# Patient Record
Sex: Male | Born: 1975 | Race: White | Hispanic: No | Smoking: Current every day smoker
Health system: Southern US, Community
[De-identification: ages and names within clinical notes are randomized; demographics above are authoritative.]

## PROBLEM LIST (undated history)

## (undated) DIAGNOSIS — E079 Disorder of thyroid, unspecified: Secondary | ICD-10-CM

## (undated) DIAGNOSIS — M25569 Pain in unspecified knee: Secondary | ICD-10-CM

## (undated) DIAGNOSIS — F1010 Alcohol abuse, uncomplicated: Secondary | ICD-10-CM

## (undated) HISTORY — PX: CLEFT PALATE REPAIR: SUR1165

---

## 1997-09-18 ENCOUNTER — Emergency Department (HOSPITAL_COMMUNITY): Admission: EM | Admit: 1997-09-18 | Discharge: 1997-09-18 | Payer: Self-pay | Admitting: Emergency Medicine

## 2008-08-21 ENCOUNTER — Emergency Department (HOSPITAL_COMMUNITY): Admission: EM | Admit: 2008-08-21 | Discharge: 2008-08-22 | Payer: Self-pay | Admitting: Emergency Medicine

## 2009-05-07 ENCOUNTER — Emergency Department: Payer: Self-pay | Admitting: Emergency Medicine

## 2009-08-13 ENCOUNTER — Emergency Department (HOSPITAL_COMMUNITY): Admission: EM | Admit: 2009-08-13 | Discharge: 2009-08-14 | Payer: Self-pay | Admitting: Emergency Medicine

## 2010-09-07 LAB — DIFFERENTIAL
Basophils Absolute: 0.1 10*3/uL (ref 0.0–0.1)
Lymphocytes Relative: 22 % (ref 12–46)
Neutro Abs: 8.6 10*3/uL — ABNORMAL HIGH (ref 1.7–7.7)
Neutrophils Relative %: 71 % (ref 43–77)

## 2010-09-07 LAB — URINALYSIS, ROUTINE W REFLEX MICROSCOPIC
Nitrite: NEGATIVE
Specific Gravity, Urine: 1.009 (ref 1.005–1.030)
Urobilinogen, UA: 0.2 mg/dL (ref 0.0–1.0)
pH: 5.5 (ref 5.0–8.0)

## 2010-09-07 LAB — BASIC METABOLIC PANEL
BUN: 5 mg/dL — ABNORMAL LOW (ref 6–23)
Calcium: 9.5 mg/dL (ref 8.4–10.5)
Creatinine, Ser: 0.73 mg/dL (ref 0.4–1.5)
GFR calc non Af Amer: >60 mL/min (ref >60)
Glucose, Bld: 96 mg/dL (ref 70–99)
Sodium: 138 mEq/L (ref 135–145)

## 2010-09-07 LAB — RAPID URINE DRUG SCREEN, HOSP PERFORMED
Amphetamines: NOT DETECTED
Barbiturates: NOT DETECTED
Benzodiazepines: NOT DETECTED

## 2010-09-07 LAB — CBC
Hemoglobin: 14.8 g/dL (ref 13.0–17.0)
Platelets: 195 10*3/uL (ref 150–400)
RDW: 13.2 % (ref 11.5–15.5)

## 2010-09-29 LAB — DIFFERENTIAL
Basophils Absolute: 0 10*3/uL (ref 0.0–0.1)
Basophils Relative: 0 % (ref 0–1)
Lymphocytes Relative: 20 % (ref 12–46)
Monocytes Absolute: 0.8 10*3/uL (ref 0.1–1.0)
Neutro Abs: 7.9 10*3/uL — ABNORMAL HIGH (ref 1.7–7.7)
Neutrophils Relative %: 72 % (ref 43–77)

## 2010-09-29 LAB — BASIC METABOLIC PANEL
Calcium: 9 mg/dL (ref 8.4–10.5)
Creatinine, Ser: 0.93 mg/dL (ref 0.4–1.5)
GFR calc Af Amer: >60 mL/min (ref >60)
GFR calc non Af Amer: >60 mL/min (ref >60)
Glucose, Bld: 92 mg/dL (ref 70–99)
Sodium: 138 mEq/L (ref 135–145)

## 2010-09-29 LAB — CBC
Hemoglobin: 14.2 g/dL (ref 13.0–17.0)
RBC: 4.95 MIL/uL (ref 4.22–5.81)
RDW: 13.8 % (ref 11.5–15.5)

## 2010-09-29 LAB — RAPID URINE DRUG SCREEN, HOSP PERFORMED
Benzodiazepines: NOT DETECTED
Cocaine: POSITIVE — AB
Opiates: NOT DETECTED

## 2010-09-29 LAB — TRICYCLICS SCREEN, URINE: TCA Scrn: NOT DETECTED

## 2012-07-09 ENCOUNTER — Emergency Department (HOSPITAL_COMMUNITY)
Admission: EM | Admit: 2012-07-09 | Discharge: 2012-07-09 | Disposition: A | Discharge status: Home / Self Care | Payer: Self-pay | Attending: Emergency Medicine | Admitting: Emergency Medicine

## 2012-07-09 ENCOUNTER — Encounter (HOSPITAL_COMMUNITY): Payer: Self-pay

## 2012-07-09 DIAGNOSIS — Y929 Unspecified place or not applicable: Secondary | ICD-10-CM | POA: Insufficient documentation

## 2012-07-09 DIAGNOSIS — R110 Nausea: Secondary | ICD-10-CM | POA: Insufficient documentation

## 2012-07-09 DIAGNOSIS — R5381 Other malaise: Secondary | ICD-10-CM | POA: Insufficient documentation

## 2012-07-09 DIAGNOSIS — T48201A Poisoning by unspecified drugs acting on muscles, accidental (unintentional), initial encounter: Secondary | ICD-10-CM | POA: Insufficient documentation

## 2012-07-09 DIAGNOSIS — Y9389 Activity, other specified: Secondary | ICD-10-CM | POA: Insufficient documentation

## 2012-07-09 DIAGNOSIS — T483X4A Poisoning by antitussives, undetermined, initial encounter: Secondary | ICD-10-CM | POA: Insufficient documentation

## 2012-07-09 DIAGNOSIS — G479 Sleep disorder, unspecified: Secondary | ICD-10-CM | POA: Insufficient documentation

## 2012-07-09 DIAGNOSIS — F19951 Other psychoactive substance use, unspecified with psychoactive substance-induced psychotic disorder with hallucinations: Secondary | ICD-10-CM | POA: Insufficient documentation

## 2012-07-09 DIAGNOSIS — F411 Generalized anxiety disorder: Secondary | ICD-10-CM | POA: Insufficient documentation

## 2012-07-09 HISTORY — DX: Pain in unspecified knee: M25.569

## 2012-07-09 LAB — LIPASE, BLOOD: Lipase: 77 U/L — ABNORMAL HIGH (ref 11–59)

## 2012-07-09 LAB — COMPREHENSIVE METABOLIC PANEL
AST: 20 U/L (ref 0–37)
BUN: 10 mg/dL (ref 6–23)
CO2: 28 mEq/L (ref 19–32)
Calcium: 9.6 mg/dL (ref 8.4–10.5)
Chloride: 102 mEq/L (ref 96–112)
Creatinine, Ser: 0.93 mg/dL (ref 0.50–1.35)
GFR calc Af Amer: >90 mL/min (ref >90)
GFR calc non Af Amer: >90 mL/min (ref >90)
Glucose, Bld: 89 mg/dL (ref 70–99)
Total Bilirubin: 0.6 mg/dL (ref 0.3–1.2)

## 2012-07-09 LAB — CBC WITH DIFFERENTIAL/PLATELET
Eosinophils Absolute: 0 10*3/uL (ref 0.0–0.7)
Hemoglobin: 15.1 g/dL (ref 13.0–17.0)
Lymphocytes Relative: 9 % — ABNORMAL LOW (ref 12–46)
Lymphs Abs: 0.9 10*3/uL (ref 0.7–4.0)
Monocytes Relative: 6 % (ref 3–12)
Neutro Abs: 9.3 10*3/uL — ABNORMAL HIGH (ref 1.7–7.7)
Neutrophils Relative %: 84 % — ABNORMAL HIGH (ref 43–77)
Platelets: 236 10*3/uL (ref 150–400)
RBC: 5.28 MIL/uL (ref 4.22–5.81)
WBC: 11 10*3/uL — ABNORMAL HIGH (ref 4.0–10.5)

## 2012-07-09 LAB — ETHANOL: Alcohol, Ethyl (B): <11 mg/dL (ref 0–11)

## 2012-07-09 MED ORDER — ONDANSETRON HCL 4 MG/2ML IJ SOLN
4.0000 mg | Freq: Once | INTRAMUSCULAR | Status: AC
  Administered 2012-07-09: 4 mg via INTRAVENOUS
  Filled 2012-07-09: qty 2

## 2012-07-09 MED ORDER — SODIUM CHLORIDE 0.9 % IV BOLUS (SEPSIS)
1000.0000 mL | Freq: Once | INTRAVENOUS | Status: AC
  Administered 2012-07-09: 1000 mL via INTRAVENOUS

## 2012-07-09 NOTE — ED Notes (Signed)
Pt states that he took delsyum and drank ETOH yesterday afternoon and this morning, he has an unsteady gait, shaky and states that he feels very weird

## 2012-07-09 NOTE — ED Notes (Signed)
Pt reports that he took a complete bottle of Delsyum one day ago with two beers and now "feels strange" today. Neuro intact denies AV hallucinations.

## 2012-07-09 NOTE — ED Provider Notes (Signed)
History     CSN: [id number]  Arrival date & time 07/09/12  4098   First MD Initiated Contact with Patient 07/09/12 [phone]      Chief Complaint  Patient presents with  . Ingestion    (Consider location/radiation/quality/duration/timing/severity/associated sxs/prior treatment) HPI  The patient presents with concerns of ongoing nausea, gait unsteadiness, hallucinations. He states that yesterday, ending approximately 12 hours ago, he ingested alcohol, beer, and Delsym.  He states that he drinks several 24 ounce bottles of beer in addition to an entire bottle of Delsym. He states that since that time he has been persistently uncomfortable, with the aforementioned complaints.  There's been no relief with resting, or anything else.  Symptoms are worse with motion, speaking. He describes visual disturbances, including hallucinations, and inappropriate motion of inanimate objects.  He denies auditory hallucinations.  He states that he has no similar prior experience.  He denies significant medical problems, beyond [facility], status post ablation.   Past Medical History  Diagnosis Date  . Knee joint pain     History reviewed. No pertinent past surgical history.  History reviewed. No pertinent family history.  History  Substance Use Topics  . Smoking status: Not on file  . Smokeless tobacco: Not on file  . Alcohol Use: Yes      Review of Systems  Constitutional:       Per HPI, otherwise negative  HENT:       Per HPI, otherwise negative  Eyes: Negative.   Respiratory:       Per HPI, otherwise negative  Cardiovascular:       Per HPI, otherwise negative  Gastrointestinal: Positive for nausea. Negative for vomiting and diarrhea.  Genitourinary: Negative.   Musculoskeletal:       Per HPI, otherwise negative  Skin: Negative.   Neurological: Positive for dizziness, weakness and light-headedness. Negative for tremors, seizures, syncope, speech difficulty, numbness and  headaches.  Psychiatric/Behavioral: Positive for hallucinations, confusion, sleep disturbance and decreased concentration. Negative for self-injury. The patient is nervous/anxious.     Allergies  Review of patient's allergies indicates no known allergies.  Home Medications   Current Outpatient Rx  Name  Route  Sig  Dispense  Refill  . NAPROXEN 500 MG PO TABS   Oral   Take 500 mg by mouth 2 (two) times daily with a meal.           BP 153/84  Pulse 96  Temp 98.1 F (36.7 C) (Oral)  Resp 20  SpO2 93%  Physical Exam  Nursing note and vitals reviewed. Constitutional: He is oriented to person, place, and time. He appears well-developed.       Anxious thin male  HENT:  Head: Normocephalic and atraumatic.  Eyes: Conjunctivae normal and EOM are normal.  Cardiovascular: Normal rate and regular rhythm.   Pulmonary/Chest: Effort normal. No stridor. No respiratory distress.  Abdominal: He exhibits no distension.  Musculoskeletal: He exhibits no edema.  Neurological: He is alert and oriented to person, place, and time.       Patient has mild discoordination, dysdiadochokinesia, but is grossly symmetrically intact  Skin: Skin is warm and dry.  Psychiatric: His speech is normal. His mood appears anxious. He is actively hallucinating. Thought content is not paranoid. Cognition and memory are normal. He expresses no homicidal and no suicidal ideation.    ED Course  Procedures (including critical care time)   Labs Reviewed  COMPREHENSIVE METABOLIC PANEL  CBC WITH DIFFERENTIAL  LIPASE, BLOOD  URINE RAPID DRUG SCREEN (HOSP PERFORMED)  ETHANOL  URINALYSIS, ROUTINE W REFLEX MICROSCOPIC   No results found.   No diagnosis found.  Pulse ox 99% room air normal    10:07 AM Patient states she is substantially better MDM  This patient presents 12 hours after last ingesting alcohol and an over-the-counter cough medication, known for its dissociative properties.  On exam the  patient is in no distress, hemodynamically stable.  With IV fluid resuscitation, the patient felt substantially better.  Patient's labs were reassuring, with no evidence of electrolyte abnormality.  Given the improvement, the absence of ongoing ingestion, he was discharged in stable condition after being cautioned against additional use of that medication.        Gerhard Munch, MD 07/09/12 1009

## 2013-03-30 ENCOUNTER — Emergency Department: Payer: Self-pay | Admitting: Emergency Medicine

## 2013-03-30 LAB — CBC
HGB: 14.7 g/dL (ref 13.0–18.0)
MCH: 28.5 pg (ref 26.0–34.0)
Platelet: 225 10*3/uL (ref 150–440)
RBC: 5.14 10*6/uL (ref 4.40–5.90)
RDW: 13.9 % (ref 11.5–14.5)

## 2013-03-30 LAB — DRUG SCREEN, URINE
Amphetamines, Ur Screen: NEGATIVE (ref ?–1000)
Benzodiazepine, Ur Scrn: NEGATIVE (ref ?–200)
Cocaine Metabolite,Ur ~~LOC~~: NEGATIVE (ref ?–300)
Methadone, Ur Screen: NEGATIVE (ref ?–300)
Opiate, Ur Screen: NEGATIVE (ref ?–300)
Phencyclidine (PCP) Ur S: NEGATIVE (ref ?–25)
Tricyclic, Ur Screen: NEGATIVE (ref ?–1000)

## 2013-03-30 LAB — URINALYSIS, COMPLETE
Glucose,UR: NEGATIVE mg/dL (ref 0–75)
Ketone: NEGATIVE
Leukocyte Esterase: NEGATIVE
Ph: 6 (ref 4.5–8.0)
Protein: NEGATIVE
RBC,UR: NONE SEEN /HPF (ref 0–5)
Specific Gravity: 1.001 (ref 1.003–1.030)
Squamous Epithelial: NONE SEEN

## 2013-03-30 LAB — COMPREHENSIVE METABOLIC PANEL
Alkaline Phosphatase: 107 U/L (ref 50–136)
BUN: 8 mg/dL (ref 7–18)
Bilirubin,Total: 0.5 mg/dL (ref 0.2–1.0)
Calcium, Total: 9 mg/dL (ref 8.5–10.1)
Co2: 27 mmol/L (ref 21–32)
Creatinine: 0.89 mg/dL (ref 0.60–1.30)
EGFR (African American): >60
EGFR (Non-African Amer.): >60
Osmolality: 266 (ref 275–301)
Potassium: 3.6 mmol/L (ref 3.5–5.1)
SGOT(AST): 130 U/L — ABNORMAL HIGH (ref 15–37)
SGPT (ALT): 135 U/L — ABNORMAL HIGH (ref 12–78)
Sodium: 134 mmol/L — ABNORMAL LOW (ref 136–145)
Total Protein: 7.4 g/dL (ref 6.4–8.2)

## 2013-03-30 LAB — ETHANOL: Ethanol: 239 mg/dL

## 2013-03-31 LAB — ETHANOL: Ethanol %: 0.095 % — ABNORMAL HIGH (ref 0.000–0.080)

## 2013-04-07 ENCOUNTER — Encounter (HOSPITAL_COMMUNITY): Payer: Self-pay | Admitting: Emergency Medicine

## 2013-04-07 ENCOUNTER — Emergency Department (HOSPITAL_COMMUNITY)
Admission: EM | Admit: 2013-04-07 | Discharge: 2013-04-08 | Disposition: A | Discharge status: Rehabilitation Facility | Payer: Self-pay | Attending: Emergency Medicine | Admitting: Emergency Medicine

## 2013-04-07 DIAGNOSIS — F17200 Nicotine dependence, unspecified, uncomplicated: Secondary | ICD-10-CM | POA: Insufficient documentation

## 2013-04-07 DIAGNOSIS — F1010 Alcohol abuse, uncomplicated: Secondary | ICD-10-CM | POA: Insufficient documentation

## 2013-04-07 HISTORY — DX: Alcohol abuse, uncomplicated: F10.10

## 2013-04-07 LAB — COMPREHENSIVE METABOLIC PANEL
ALT: 166 U/L — ABNORMAL HIGH (ref 0–53)
Albumin: 3.9 g/dL (ref 3.5–5.2)
Alkaline Phosphatase: 93 U/L (ref 39–117)
BUN: 7 mg/dL (ref 6–23)
Chloride: 104 mEq/L (ref 96–112)
Glucose, Bld: 93 mg/dL (ref 70–99)
Potassium: 4.2 mEq/L (ref 3.5–5.1)
Sodium: 140 mEq/L (ref 135–145)
Total Bilirubin: <0.1 mg/dL — ABNORMAL LOW (ref 0.3–1.2)
Total Protein: 7.1 g/dL (ref 6.0–8.3)

## 2013-04-07 LAB — CBC
HCT: 42.1 % (ref 39.0–52.0)
Hemoglobin: 14.3 g/dL (ref 13.0–17.0)
MCHC: 34 g/dL (ref 30.0–36.0)
RDW: 13.6 % (ref 11.5–15.5)
WBC: 6.7 10*3/uL (ref 4.0–10.5)

## 2013-04-07 LAB — RAPID URINE DRUG SCREEN, HOSP PERFORMED
Amphetamines: NOT DETECTED
Barbiturates: NOT DETECTED
Benzodiazepines: NOT DETECTED

## 2013-04-07 MED ORDER — LORAZEPAM 1 MG PO TABS: 0.0000 mg | ORAL_TABLET | Freq: Four times a day (QID) | ORAL | Status: DC

## 2013-04-07 MED ORDER — VITAMIN B-1 100 MG PO TABS
100.0000 mg | ORAL_TABLET | Freq: Every day | ORAL | Status: DC
  Administered 2013-04-08: 09:00:00 100 mg via ORAL
  Filled 2013-04-07: qty 1

## 2013-04-07 MED ORDER — THIAMINE HCL 100 MG/ML IJ SOLN: 100.0000 mg | Freq: Every day | INTRAMUSCULAR | Status: DC

## 2013-04-07 MED ORDER — LORAZEPAM 1 MG PO TABS: 0.0000 mg | ORAL_TABLET | Freq: Two times a day (BID) | ORAL | Status: DC

## 2013-04-07 NOTE — ED Notes (Signed)
Pt brought in by EMS requesting detox from alcohol  Pt states he drinks about a case a day  Pt states he has been drinking since he was 37 years old

## 2013-04-07 NOTE — ED Notes (Signed)
Pt belongs has: brown boots, [place] hat, blue jeans, black belt, black flip phone, electronic cig, green lighter, dark green wallet with [place] driver license, social security card, blue watch, (5) one dollar bills, and changes, white socks, dark blue t-shirt

## 2013-04-08 ENCOUNTER — Encounter (HOSPITAL_COMMUNITY): Payer: Self-pay | Admitting: Registered Nurse

## 2013-04-08 DIAGNOSIS — Z9289 Personal history of other medical treatment: Secondary | ICD-10-CM

## 2013-04-08 DIAGNOSIS — F1010 Alcohol abuse, uncomplicated: Secondary | ICD-10-CM | POA: Diagnosis present

## 2013-04-08 DIAGNOSIS — F1910 Other psychoactive substance abuse, uncomplicated: Secondary | ICD-10-CM

## 2013-04-08 LAB — ETHANOL: Alcohol, Ethyl (B): 24 mg/dL — ABNORMAL HIGH (ref 0–11)

## 2013-04-08 NOTE — ED Provider Notes (Signed)
Medical screening examination/treatment/procedure(s) were performed by non-physician practitioner and as supervising physician I was immediately available for consultation/collaboration.  Shon Baton, MD 04/08/13 [phone]

## 2013-04-08 NOTE — ED Provider Notes (Signed)
CSN: [id number]     Arrival date & time 04/07/13  2048 History   First MD Initiated Contact with Patient 04/07/13 2220     Chief Complaint  Patient presents with  . Medical Clearance   (Consider location/radiation/quality/duration/timing/severity/associated sxs/prior Treatment) HPI Comments: Patient here requesting detox from alcohol - reports that he drinks daily including today, usually drinking about 1 case of beer a day.  He reports that his last drink was several hours prior to arrival here.  He states that he has tried to quit on numerous occasions and has stayed clean for only short periods of time.  He reports no history of withdrawal or DT's.  He denies SI/HI.  Patient is a 37 y.o. male presenting with alcohol problem. The history is provided by the patient. No language interpreter was used.  Alcohol Problem This is a chronic problem. The problem occurs constantly. The problem has been unchanged. Pertinent negatives include no abdominal pain, arthralgias, chest pain, coughing, headaches, myalgias, nausea, numbness, sore throat, urinary symptoms, vertigo, visual change or vomiting. Nothing aggravates the symptoms. He has tried nothing for the symptoms. The treatment provided no relief.    Past Medical History  Diagnosis Date  . Knee joint pain   . Alcohol abuse    Past Surgical History  Procedure Laterality Date  . Cleft palate repair     Family History  Problem Relation Age of Onset  . Cancer Other    History  Substance Use Topics  . Smoking status: Current Every Day Smoker  . Smokeless tobacco: Not on file  . Alcohol Use: Yes     Comment: heavy    Review of Systems  HENT: Negative for sore throat.   Respiratory: Negative for cough.   Cardiovascular: Negative for chest pain.  Gastrointestinal: Negative for nausea, vomiting and abdominal pain.  Musculoskeletal: Negative for arthralgias and myalgias.  Neurological: Negative for vertigo, numbness and headaches.  All  other systems reviewed and are negative.    Allergies  Review of patient's allergies indicates no known allergies.  Home Medications   Current Outpatient Rx  Name  Route  Sig  Dispense  Refill  . acetaminophen (TYLENOL) 325 MG tablet   Oral   Take 650 mg by mouth every 6 (six) hours as needed for pain (pain/back pain).          BP 124/83  Pulse 99  Temp(Src) 98.2 F (36.8 C) (Oral)  Resp 20  SpO2 95% Physical Exam  Nursing note and vitals reviewed. Constitutional: He is oriented to person, place, and time. He appears well-developed and well-nourished. No distress.  HENT:  Head: Normocephalic and atraumatic.  Right Ear: External ear normal.  Left Ear: External ear normal.  Nose: Nose normal.  Mouth/Throat: Oropharynx is clear and moist. No oropharyngeal exudate.  Eyes: Conjunctivae are normal. Pupils are equal, round, and reactive to light. No scleral icterus.  Neck: Neck supple.  Cardiovascular: Normal rate, regular rhythm and normal heart sounds.  Exam reveals no gallop and no friction rub.   No murmur heard. Pulmonary/Chest: Effort normal and breath sounds normal. No respiratory distress. He has no wheezes. He has no rales. He exhibits no tenderness.  Abdominal: Soft. Bowel sounds are normal. He exhibits no distension. There is no tenderness.  Musculoskeletal: Normal range of motion. He exhibits no edema and no tenderness.  Lymphadenopathy:    He has no cervical adenopathy.  Neurological: He is alert and oriented to person, place, and time. He exhibits normal  muscle tone. Coordination normal.  Skin: Skin is warm and dry. No rash noted. No erythema. No pallor.  Psychiatric: He has a normal mood and affect. His behavior is normal. Judgment and thought content normal.    ED Course  Procedures (including critical care time) Labs Review Labs Reviewed  COMPREHENSIVE METABOLIC PANEL - Abnormal; Notable for the following:    AST 76 (*)    ALT 166 (*)    Total Bilirubin  <0.1 (*)    All other components within normal limits  ETHANOL - Abnormal; Notable for the following:    Alcohol, Ethyl (B) 293 (*)    All other components within normal limits  URINE RAPID DRUG SCREEN (HOSP PERFORMED) - Abnormal; Notable for the following:    Tetrahydrocannabinol POSITIVE (*)    All other components within normal limits  CBC   Imaging Review No results found.  EKG Interpretation   None      Results for orders placed during the hospital encounter of 04/07/13  CBC      Result Value Range   WBC 6.7  4.0 - 10.5 K/uL   RBC 4.99  4.22 - 5.81 MIL/uL   Hemoglobin 14.3  13.0 - 17.0 g/dL   HCT 09.8  11.9 - 14.7 %   MCV 84.4  78.0 - 100.0 fL   MCH 28.7  26.0 - 34.0 pg   MCHC 34.0  30.0 - 36.0 g/dL   RDW 82.9  56.2 - 13.0 %   Platelets 238  150 - 400 K/uL  COMPREHENSIVE METABOLIC PANEL      Result Value Range   Sodium 140  135 - 145 mEq/L   Potassium 4.2  3.5 - 5.1 mEq/L   Chloride 104  96 - 112 mEq/L   CO2 25  19 - 32 mEq/L   Glucose, Bld 93  70 - 99 mg/dL   BUN 7  6 - 23 mg/dL   Creatinine, Ser 8.65  0.50 - 1.35 mg/dL   Calcium 8.9  8.4 - 78.4 mg/dL   Total Protein 7.1  6.0 - 8.3 g/dL   Albumin 3.9  3.5 - 5.2 g/dL   AST 76 (*) 0 - 37 U/L   ALT 166 (*) 0 - 53 U/L   Alkaline Phosphatase 93  39 - 117 U/L   Total Bilirubin <0.1 (*) 0.3 - 1.2 mg/dL   GFR calc non Af Amer >90  >90 mL/min   GFR calc Af Amer >90  >90 mL/min  ETHANOL      Result Value Range   Alcohol, Ethyl (B) 293 (*) 0 - 11 mg/dL  URINE RAPID DRUG SCREEN (HOSP PERFORMED)      Result Value Range   Opiates NONE DETECTED  NONE DETECTED   Cocaine NONE DETECTED  NONE DETECTED   Benzodiazepines NONE DETECTED  NONE DETECTED   Amphetamines NONE DETECTED  NONE DETECTED   Tetrahydrocannabinol POSITIVE (*) NONE DETECTED   Barbiturates NONE DETECTED  NONE DETECTED   No results found.   MDM  Alcohol abuse  Patient with long history of alcohol abuse presents requesting detox.  Alcohol level is  currently 293, though he is able to converse freely will await until alcohol level much lower to continue ACT assessment.  Izola Price Marisue Humble, PA-C 04/08/13 [phone]

## 2013-04-08 NOTE — BH Assessment (Signed)
Tele Assessment Note   Anthony Beck is an 37 y.o. male.  Patient came to [facility] seeking assistance with getting ETOH detox.  Patient reports drinking a case of beer daily for at least the last 6 months.  His last drink was on 10/20 around 20:00.  Patient reports no withdrawal symptoms at this time.  He has had a 3 year period of being sober which was about 3 years ago.  He uses marijuana but not too often, 1-2 times per month.  Patient denies any current SI/HI or A/V hallucinations.  He was in detox at [facility] abut 2 years ago.   -Patient care was discussed with this clinician and Dr. Dierdre Highman.  Patient has an elevated BAL and will need to be below 160 to be considered by [facility] or [facility].  A new BAL will be done at 07:00 then patient will be referred to the aforementioned facilities for detox. Axis I: 303.90 ETOH dependence Axis II: Deferred Axis III:  Past Medical History  Diagnosis Date  . Knee joint pain   . Alcohol abuse    Axis IV: housing problems, occupational problems and problems with primary support group Axis V: 31-40 impairment in reality testing  Past Medical History:  Past Medical History  Diagnosis Date  . Knee joint pain   . Alcohol abuse     Past Surgical History  Procedure Laterality Date  . Cleft palate repair      Family History:  Family History  Problem Relation Age of Onset  . Cancer Other     Social History:  reports that he has been smoking.  He does not have any smokeless tobacco history on file. He reports that he drinks alcohol. He reports that he does not use illicit drugs.  Additional Social History:  Alcohol / Drug Use Pain Medications: None Prescriptions: N/A Over the Counter: N/A History of alcohol / drug use?: Yes Longest period of sobriety (when/how long): Was sober for 3 years. Negative Consequences of Use: Personal relationships Withdrawal Symptoms: Diarrhea;Weakness;Tremors;Patient aware of relationship between substance abuse and physical/medical  complications;Nausea / Vomiting Substance #1 Name of Substance 1: ETOH, usually beer 1 - Age of First Use: 37 years of age 69 - Amount (size/oz): Drinking a case of beer daily 1 - Frequency: Daily consumption 1 - Duration: Drinking at that rate for last 6 months 1 - Last Use / Amount: 10/20 around 20:00 Substance #2 Name of Substance 2: Marijuana 2 - Age of First Use: Teens 2 - Amount (size/oz): Unknown 2 - Frequency: 1-2 times in a month 2 - Duration: on-going 2 - Last Use / Amount: 10/20  CIWA: CIWA-Ar BP: 124/82 mmHg Pulse Rate: 94 Nausea and Vomiting: no nausea and no vomiting Tactile Disturbances: none Tremor: no tremor Auditory Disturbances: not present Paroxysmal Sweats: no sweat visible Visual Disturbances: not present Anxiety: no anxiety, at ease Headache, Fullness in Head: none present Agitation: normal activity Orientation and Clouding of Sensorium: oriented and can do serial additions CIWA-Ar Total: 0 COWS:    Allergies: No Known Allergies  Home Medications:  (Not in a hospital admission)  OB/GYN Status:  No LMP for male patient.  General Assessment Data Location of Assessment: [facility] ED Is this a Tele or Face-to-Face Assessment?: Tele Assessment Is this an Initial Assessment or a Re-assessment for this encounter?: Initial Assessment Living Arrangements: Other (Comment) (Homeless in [place]) Can pt return to current living arrangement?: Yes Admission Status: Voluntary Is patient capable of signing voluntary admission?: Yes  Transfer from: Acute Hospital Referral Source: Self/Family/Friend     [facility] Crisis Care Plan Living Arrangements: Other (Comment) (Homeless in [facility]) Name of Psychiatrist: N/A Name of Therapist: N/A     Risk to self Suicidal Ideation: No Suicidal Intent: No Is patient at risk for suicide?: No Suicidal Plan?: No Access to Means: No What has been your use of drugs/alcohol within the last 12 months?: ETOH daily  use Previous Attempts/Gestures: No How many times?: 0 Other Self Harm Risks: SA issues Triggers for Past Attempts: None known Intentional Self Injurious Behavior: None Family Suicide History: No Recent stressful life event(s): Other (Comment);Financial Problems (Homelessness) Persecutory voices/beliefs?: No Depression: No Depression Symptoms:  (Pt reports no depressive symptoms) Substance abuse history and/or treatment for substance abuse?: Yes Suicide prevention information given to non-admitted patients: Not applicable  Risk to Others Homicidal Ideation: No Thoughts of Harm to Others: No Current Homicidal Intent: No Current Homicidal Plan: No Access to Homicidal Means: No Identified Victim: No one History of harm to others?: No Assessment of Violence: None Noted Violent Behavior Description: Pt calm and cooperative Does patient have access to weapons?: No Criminal Charges Pending?: No Does patient have a court date: No  Psychosis Hallucinations: None noted Delusions: None noted  Mental Status Report Appear/Hygiene: Disheveled Eye Contact: Fair Motor Activity: Freedom of movement;Unremarkable Speech: Logical/coherent Level of Consciousness: Quiet/awake Mood: Helpless Affect: Appropriate to circumstance Anxiety Level: None Thought Processes: Coherent;Relevant Judgement: Impaired Orientation: Person;Place;Time;Situation;Appropriate for developmental age Obsessive Compulsive Thoughts/Behaviors: None  Cognitive Functioning Concentration: Normal Memory: Recent Intact;Remote Intact IQ: Average Insight: Fair Impulse Control: Poor Appetite: Good Weight Loss: 0 Weight Gain: 0 Sleep: No Change Total Hours of Sleep: 6 Vegetative Symptoms: None  ADLScreening [facility] Assessment Services) Patient's cognitive ability adequate to safely complete daily activities?: Yes Patient able to express need for assistance with ADLs?: Yes Independently performs ADLs?: Yes (appropriate  for developmental age)  Prior Inpatient Therapy Prior Inpatient Therapy: Yes Prior Therapy Dates: 2 years ago Prior Therapy Facilty/Provider(s): [facility] Reason for Treatment: Detox  Prior Outpatient Therapy Prior Outpatient Therapy: No Prior Therapy Dates: None Prior Therapy Facilty/Provider(s): None Reason for Treatment: None  ADL Screening (condition at time of admission) Patient's cognitive ability adequate to safely complete daily activities?: Yes Is the patient deaf or have difficulty hearing?: No Does the patient have difficulty seeing, even when wearing glasses/contacts?: No Does the patient have difficulty concentrating, remembering, or making decisions?: No Patient able to express need for assistance with ADLs?: Yes Does the patient have difficulty dressing or bathing?: No Independently performs ADLs?: Yes (appropriate for developmental age) Does the patient have difficulty walking or climbing stairs?: No Weakness of Legs: None Weakness of Arms/Hands: None  Home Assistive Devices/Equipment Home Assistive Devices/Equipment: None    Abuse/Neglect Assessment (Assessment to be complete while patient is alone) Physical Abuse: Denies Verbal Abuse: Denies Sexual Abuse: Denies Exploitation of patient/patient's resources: Denies Self-Neglect: Denies Values / Beliefs Cultural Requests During Hospitalization: None Spiritual Requests During Hospitalization: None   Advance Directives (For Healthcare) Advance Directive: Patient does not have advance directive;Patient would not like information    Additional Information 1:1 In Past 12 Months?: No CIRT Risk: No Elopement Risk: No Does patient have medical clearance?: Yes     Disposition:  Disposition Initial Assessment Completed for this Encounter: Yes Disposition of Patient: Inpatient treatment program;Referred to Type of inpatient treatment program: Adult Patient referred to: [facility]  Beatriz Stallion Ray 04/08/2013  6:31 AM

## 2013-04-08 NOTE — BH Assessment (Signed)
Writer has tried to reach the [facility] [phone] again. Staff did not answer the phone. Another voicemail was left.

## 2013-04-08 NOTE — ED Notes (Signed)
Lab drawn from pt.'s R. A/C, sterile technique used, pt. Tolerated well. Lab sent to stat lab.

## 2013-04-08 NOTE — ED Notes (Signed)
Pt transferred from triage, presents for Alcohol Detox.  Pt reports he drinks 1 case of beer per day.  Denies drug use.  Denies SI or HI, no AV hallucinations.  Pt calm & cooperative at present.

## 2013-04-08 NOTE — Consult Note (Signed)
[facility] Face-to-Face Psychiatry Consult   Reason for Consult:  Evaluation for inpatient treatment Referring Physician:  EDP  Anthony Beck is an 37 y.o. male.  Assessment: AXIS I:  Alcohol Abuse and Substance Abuse AXIS II:  Deferred AXIS III:   Past Medical History  Diagnosis Date  . Knee joint pain   . Alcohol abuse    AXIS IV:  housing problems, other psychosocial or environmental problems and problems related to social environment AXIS V:  51-60 moderate symptoms  Plan:  No evidence of imminent risk to self or others at present.   Patient does not meet criteria for psychiatric inpatient admission. Supportive therapy provided about ongoing stressors.  Subjective:   Anthony Beck is a 37 y.o. male.  HPI:  Patient states "I want alcohol detox; I've got to stop drinking."  Patient states for the last two months he has been drinking a case a day (24 beers).  Patient states that there is no specific trigger he has just increase the intake of alcohol.  Patient states that he does not have a home of his own but stays place to place with friends.  Patient states that he was at [facility] "a few years ago. I stayed sober for a few weeks."  Patient states that he did start drinking again but he was drinking as much as he was prior to [facility].  "I started drinking when I was 38 yrs old."  Patient denies suicidal ideation, homicidal ideation, psychosis, and paranoia.    HPI Elements:   Location:  [facility] ED. Quality:  Affecting patient physically. Severity:  Drinking 24 beers a day.  Past Psychiatric History: Past Medical History  Diagnosis Date  . Knee joint pain   . Alcohol abuse     reports that he has been smoking.  He does not have any smokeless tobacco history on file. He reports that he drinks alcohol. He reports that he does not use illicit drugs. Family History  Problem Relation Age of Onset  . Cancer Other    Family History Substance Abuse: Yes, Describe: (ETOH runs in the  family) Family Supports: No Living Arrangements: Other (Comment) (Homeless in [place]) Can pt return to current living arrangement?: Yes Abuse/Neglect [facility]) Physical Abuse: Denies Verbal Abuse: Denies Sexual Abuse: Denies Allergies:  No Known Allergies  ACT Assessment Complete:  Yes:    Educational Status    Risk to Self: Risk to self Suicidal Ideation: No Suicidal Intent: No Is patient at risk for suicide?: No Suicidal Plan?: No Access to Means: No What has been your use of drugs/alcohol within the last 12 months?: ETOH daily use Previous Attempts/Gestures: No How many times?: 0 Other Self Harm Risks: SA issues Triggers for Past Attempts: None known Intentional Self Injurious Behavior: None Family Suicide History: No Recent stressful life event(s): Other (Comment);Financial Problems (Homelessness) Persecutory voices/beliefs?: No Depression: No Depression Symptoms:  (Pt reports no depressive symptoms) Substance abuse history and/or treatment for substance abuse?: Yes Suicide prevention information given to non-admitted patients: Not applicable  Risk to Others: Risk to Others Homicidal Ideation: No Thoughts of Harm to Others: No Current Homicidal Intent: No Current Homicidal Plan: No Access to Homicidal Means: No Identified Victim: No one History of harm to others?: No Assessment of Violence: None Noted Violent Behavior Description: Pt calm and cooperative Does patient have access to weapons?: No Criminal Charges Pending?: No Does patient have a court date: No  Abuse: Abuse/Neglect Assessment (Assessment to be complete while patient  is alone) Physical Abuse: Denies Verbal Abuse: Denies Sexual Abuse: Denies Exploitation of patient/patient's resources: Denies Self-Neglect: Denies  Prior Inpatient Therapy: Prior Inpatient Therapy Prior Inpatient Therapy: Yes Prior Therapy Dates: 2 years ago Prior Therapy Facilty/Provider(s): [facility] Reason for Treatment: Detox   Prior Outpatient Therapy: Prior Outpatient Therapy Prior Outpatient Therapy: No Prior Therapy Dates: None Prior Therapy Facilty/Provider(s): None Reason for Treatment: None  Additional Information: Additional Information 1:1 In Past 12 Months?: No CIRT Risk: No Elopement Risk: No Does patient have medical clearance?: Yes                  Objective: Blood pressure 124/82, pulse 94, temperature 98.4 F (36.9 C), temperature source Oral, resp. rate 17, SpO2 97.00%.There is no height or weight on file to calculate BMI. Results for orders placed during the hospital encounter of 04/07/13 (from the past 72 hour(s))  URINE RAPID DRUG SCREEN (HOSP PERFORMED)     Status: Abnormal   Collection Time    04/07/13  9:22 PM      Result Value Range   Opiates NONE DETECTED  NONE DETECTED   Cocaine NONE DETECTED  NONE DETECTED   Benzodiazepines NONE DETECTED  NONE DETECTED   Amphetamines NONE DETECTED  NONE DETECTED   Tetrahydrocannabinol POSITIVE (*) NONE DETECTED   Barbiturates NONE DETECTED  NONE DETECTED   Comment:            DRUG SCREEN FOR MEDICAL PURPOSES     ONLY.  IF CONFIRMATION IS NEEDED     FOR ANY PURPOSE, NOTIFY LAB     WITHIN 5 DAYS.                LOWEST DETECTABLE LIMITS     FOR URINE DRUG SCREEN     Drug Class       Cutoff (ng/mL)     Amphetamine      1000     Barbiturate      200     Benzodiazepine   200     Tricyclics       300     Opiates          300     Cocaine          300     THC              50  CBC     Status: None   Collection Time    04/07/13 10:20 PM      Result Value Range   WBC 6.7  4.0 - 10.5 K/uL   RBC 4.99  4.22 - 5.81 MIL/uL   Hemoglobin 14.3  13.0 - 17.0 g/dL   HCT 40.9  81.1 - 91.4 %   MCV 84.4  78.0 - 100.0 fL   MCH 28.7  26.0 - 34.0 pg   MCHC 34.0  30.0 - 36.0 g/dL   RDW 78.2  95.6 - 21.3 %   Platelets 238  150 - 400 K/uL  COMPREHENSIVE METABOLIC PANEL     Status: Abnormal   Collection Time    04/07/13 10:20 PM      Result  Value Range   Sodium 140  135 - 145 mEq/L   Potassium 4.2  3.5 - 5.1 mEq/L   Chloride 104  96 - 112 mEq/L   CO2 25  19 - 32 mEq/L   Glucose, Bld 93  70 - 99 mg/dL   BUN 7  6 - 23 mg/dL   Creatinine,  Ser 0.86  0.50 - 1.35 mg/dL   Calcium 8.9  8.4 - 16.1 mg/dL   Total Protein 7.1  6.0 - 8.3 g/dL   Albumin 3.9  3.5 - 5.2 g/dL   AST 76 (*) 0 - 37 U/L   ALT 166 (*) 0 - 53 U/L   Alkaline Phosphatase 93  39 - 117 U/L   Total Bilirubin <0.1 (*) 0.3 - 1.2 mg/dL   Comment: REPEATED TO VERIFY   GFR calc non Af Amer >90  >90 mL/min   GFR calc Af Amer >90  >90 mL/min   Comment: (NOTE)     The eGFR has been calculated using the CKD EPI equation.     This calculation has not been validated in all clinical situations.     eGFR's persistently <90 mL/min signify possible Chronic Kidney     Disease.  ETHANOL     Status: Abnormal   Collection Time    04/07/13 10:20 PM      Result Value Range   Alcohol, Ethyl (B) 293 (*) 0 - 11 mg/dL   Comment:            LOWEST DETECTABLE LIMIT FOR     SERUM ALCOHOL IS 11 mg/dL     FOR MEDICAL PURPOSES ONLY  ETHANOL     Status: Abnormal   Collection Time    04/08/13  8:45 AM      Result Value Range   Alcohol, Ethyl (B) 24 (*) 0 - 11 mg/dL   Comment:            LOWEST DETECTABLE LIMIT FOR     SERUM ALCOHOL IS 11 mg/dL     FOR MEDICAL PURPOSES ONLY     Current Facility-Administered Medications  Medication Dose Route Frequency Provider Last Rate Last Dose  . LORazepam (ATIVAN) tablet 0-4 mg  0-4 mg Oral Q6H Frances C. Sanford, PA-C       Followed by  . [START ON 04/10/2013] LORazepam (ATIVAN) tablet 0-4 mg  0-4 mg Oral Q12H Frances C. Sanford, PA-C      . thiamine (VITAMIN B-1) tablet 100 mg  100 mg Oral Daily Frances C. Sanford, PA-C   100 mg at 04/08/13 0960   Or  . thiamine (B-1) injection 100 mg  100 mg Intravenous Daily Anthony Calico C. Marisue Humble, PA-C       Current Outpatient Prescriptions  Medication Sig Dispense Refill  . acetaminophen (TYLENOL) 325  MG tablet Take 650 mg by mouth every 6 (six) hours as needed for pain (pain/back pain).        Psychiatric Specialty Exam:     Blood pressure 124/82, pulse 94, temperature 98.4 F (36.9 C), temperature source Oral, resp. rate 17, SpO2 97.00%.There is no height or weight on file to calculate BMI.  General Appearance: Casual and Fairly Groomed  Patent attorney::  Good  Speech:  Clear and Coherent and Normal Rate  Volume:  Normal  Mood:  "Good"  Affect:  Appropriate  Thought Process:  Circumstantial, Coherent and Goal Directed  Orientation:  Full (Time, Place, and Person)  Thought Content:  WDL  Suicidal Thoughts:  No  Homicidal Thoughts:  No  Memory:  Immediate;   Good Recent;   Good Remote;   Good  Judgement:  Fair  Insight:  Present  Psychomotor Activity:  "I got the shakes a little"  Concentration:  Fair  Recall:  Good  Akathisia:  No  Handed:  Right  AIMS (if indicated):  Assets:  Communication Skills Desire for Improvement  Sleep:      Face to face interview and consult with Dr. Ladona Ridgel  Treatment Plan Summary: Daily contact with patient to assess and evaluate symptoms and progress in treatment Rehab resources  Disposition:  CW/SW check with [facility] and [facility] for bed availability.    Assunta Found, FNP-BC 04/08/2013 9:39 AM  Just made aware that patient was discharged from [facility] 04/03/2013 and was suppose to follow up with the [facility].     Patient will be discharged to go to [facility] which is affiliated with the [facility] and works with alcohol and substance abuse.  Give other resources for rehab services.

## 2013-04-08 NOTE — BH Assessment (Signed)
Attempted to contact [facility] multiple x's [phone] and [phone]. This line is not viable at this time and seems phone line is disconnected. Writer will continue trying to refer patient to [facility]   Meanwhile, contacted [facility] # [phone] and spoke to Savage. She confirmed that beds are available. Writer discussed going to [facility] with patient and he agreed with receiving treatment at this facility if accepted. Writer completed referral and faxed to [facility] for their staff to review.

## 2013-04-08 NOTE — BH Assessment (Signed)
Writer attempted to refer patient to [facility]. Completed the referral packet and contacted the LME ([facility]) [phone]. Per [facility], patient would not qualify for detox as he "just left detox". Sts that he was discharged from [facility] 04/03/2013 with a follow up plan to go to the [facility].  *[facility] staff state that patient was accepted to the facility and arrangements were made.   Writer discussed with patient the above information. He sts that he went to the [facility], however; no beds were available.   Writer has tried to reach the [facility] [phone] today so that another referral is made.  Left a voicemail asking staff to call back.

## 2013-04-08 NOTE — BH Assessment (Signed)
Per ED notes, pending referral to [facility] or [facility] once ETOH level is below <160. Writer has consulted with patient's nurse-Jennifer and she will re-draw patient's labs for updated ETOH results.

## 2013-04-08 NOTE — ED Provider Notes (Signed)
Pt alert, content, nad. No tremor or shakes.  Pt recently was d/cd from inpatient etoh detox program, electing not to maintain sobriety. Pt does not require inpt detox, and appears stable for d/c.  Social work/psych team has made arrangements for shelter. Will also provide referrals for outpt rehab programs.   Suzi Roots, MD 04/08/13 [phone]

## 2013-04-10 NOTE — Consult Note (Signed)
Note reviewed and agreed with  

## 2013-04-23 ENCOUNTER — Emergency Department (HOSPITAL_COMMUNITY)
Admission: EM | Admit: 2013-04-23 | Discharge: 2013-04-23 | Disposition: A | Discharge status: Home / Self Care | Payer: Self-pay | Attending: Emergency Medicine | Admitting: Emergency Medicine

## 2013-04-23 ENCOUNTER — Encounter (HOSPITAL_COMMUNITY): Payer: Self-pay | Admitting: Emergency Medicine

## 2013-04-23 DIAGNOSIS — Z765 Malingerer [conscious simulation]: Secondary | ICD-10-CM | POA: Insufficient documentation

## 2013-04-23 DIAGNOSIS — F1010 Alcohol abuse, uncomplicated: Secondary | ICD-10-CM | POA: Insufficient documentation

## 2013-04-23 DIAGNOSIS — F10929 Alcohol use, unspecified with intoxication, unspecified: Secondary | ICD-10-CM

## 2013-04-23 DIAGNOSIS — F17200 Nicotine dependence, unspecified, uncomplicated: Secondary | ICD-10-CM | POA: Insufficient documentation

## 2013-04-23 LAB — CBC WITH DIFFERENTIAL/PLATELET
Basophils Absolute: 0.1 10*3/uL (ref 0.0–0.1)
Basophils Relative: 1 % (ref 0–1)
Eosinophils Relative: 2 % (ref 0–5)
HCT: 44 % (ref 39.0–52.0)
Lymphocytes Relative: 37 % (ref 12–46)
MCH: 28.7 pg (ref 26.0–34.0)
MCHC: 34.1 g/dL (ref 30.0–36.0)
MCV: 84.3 fL (ref 78.0–100.0)
Monocytes Absolute: 0.4 10*3/uL (ref 0.1–1.0)
Neutro Abs: 3.9 10*3/uL (ref 1.7–7.7)
Platelets: 229 10*3/uL (ref 150–400)
RDW: 13.7 % (ref 11.5–15.5)
WBC: 7.3 10*3/uL (ref 4.0–10.5)

## 2013-04-23 LAB — BASIC METABOLIC PANEL
BUN: 5 mg/dL — ABNORMAL LOW (ref 6–23)
Chloride: 102 mEq/L (ref 96–112)
Creatinine, Ser: 0.96 mg/dL (ref 0.50–1.35)
GFR calc Af Amer: >90 mL/min (ref >90)
GFR calc non Af Amer: >90 mL/min (ref >90)

## 2013-04-23 LAB — ETHANOL: Alcohol, Ethyl (B): 309 mg/dL — ABNORMAL HIGH (ref 0–11)
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
## 2013-04-23 NOTE — ED Notes (Signed)
Pt reports wanting detox form alcohol. States that he drank last night beer, wine and liquor "A lot". Pt requesting something for tremors. No tremor present on assessment. Pt denies SI/HI but states hearing spirits calling his name. Pt recall smoking cocaine yesterday.

## 2014-01-16 ENCOUNTER — Emergency Department (HOSPITAL_COMMUNITY)
Admission: EM | Admit: 2014-01-16 | Discharge: 2014-01-16 | Discharge status: Against Medical Advice | Payer: Self-pay | Attending: Emergency Medicine | Admitting: Emergency Medicine

## 2014-01-16 ENCOUNTER — Encounter (HOSPITAL_COMMUNITY): Payer: Self-pay | Admitting: Emergency Medicine

## 2014-01-16 DIAGNOSIS — F17200 Nicotine dependence, unspecified, uncomplicated: Secondary | ICD-10-CM | POA: Insufficient documentation

## 2014-01-16 DIAGNOSIS — T401X4A Poisoning by heroin, undetermined, initial encounter: Secondary | ICD-10-CM | POA: Insufficient documentation

## 2014-01-16 DIAGNOSIS — Y9389 Activity, other specified: Secondary | ICD-10-CM | POA: Insufficient documentation

## 2014-01-16 DIAGNOSIS — T401X1A Poisoning by heroin, accidental (unintentional), initial encounter: Secondary | ICD-10-CM | POA: Insufficient documentation

## 2014-01-16 DIAGNOSIS — Y929 Unspecified place or not applicable: Secondary | ICD-10-CM | POA: Insufficient documentation

## 2014-01-16 NOTE — ED Notes (Signed)
Pt A&Ox4. Advised to stay and receive treatment. Educated on benefits of staying and being medically cleared. Refuses kindly. Given bus ticket and AVS.
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
## 2014-01-16 NOTE — Discharge Instructions (Signed)
Call 911 if you start to have any trouble breathing or become drowsy.  Accidental Overdose A drug overdose occurs when a chemical substance (drug or medication) is used in amounts large enough to overcome a person. This may result in severe illness or death. This is a type of poisoning. Accidental overdoses of medications or other substances come from a variety of reasons. When this happens accidentally, it is often because the person taking the substance does not know enough about what they have taken. Drugs which commonly cause overdose deaths are alcohol, psychotropic medications (medications which affect the mind), pain medications, illegal drugs (street drugs) such as cocaine and heroin, and multiple drugs taken at the same time. It may result from careless behavior (such as over-indulging at a party). Other causes of overdose may include multiple drug use, a lapse in memory, or drug use after a period of no drug use.  Sometimes overdosing occurs because a person cannot remember if they have taken their medication.  A common unintentional overdose in young children involves multi-vitamins containing iron. Iron is a part of the hemoglobin molecule in blood. It is used to transport oxygen to living cells. When taken in small amounts, iron allows the body to restock hemoglobin. In large amounts, it causes problems in the body. If this overdose is not treated, it can lead to death. Never take medicines that show signs of tampering or do not seem quite right. Never take medicines in the dark or in poor lighting. Read the label and check each dose of medicine before you take it. When adults are poisoned, it happens most often through carelessness or lack of information. Taking medicines in the dark or taking medicine prescribed for someone else to treat the same type of problem is a dangerous practice. SYMPTOMS  Symptoms of overdose depend on the medication and amount taken. They can vary from over-activity  with stimulant over-dosage, to sleepiness from depressants such as alcohol, narcotics and tranquilizers. Confusion, dizziness, nausea and vomiting may be present. If problems are severe enough coma and death may result. DIAGNOSIS  Diagnosis and management are generally straightforward if the drug is known. Otherwise it is more difficult. At times, certain symptoms and signs exhibited by the patient, or blood tests, can reveal the drug in question.  TREATMENT  In an emergency department, most patients can be treated with supportive measures. Antidotes may be available if there has been an overdose of opioids or benzodiazepines. A rapid improvement will often occur if this is the cause of overdose. At home or away from medical care:  There may be no immediate problems or warning signs in children.  Not everything works well in all cases of poisoning.  Take immediate action. Poisons may act quickly.  If you think someone has swallowed medicine or a household product, and the person is unconscious, having seizures (convulsions), or is not breathing, immediately call for an ambulance. IF a person is conscious and appears to be doing OK but has swallowed a poison:  Do not wait to see what effect the poison will have. Immediately call a poison control center (listed in the white pages of your telephone book under "Poison Control" or inside the front cover with other emergency numbers). Some poison control centers have TTY capability for the deaf. Check with your local center if you or someone in your family requires this service.  Keep the container so you can read the label on the product for ingredients.  Describe what, when,  and how much was taken and the age and condition of the person poisoned. Inform them if the person is vomiting, choking, drowsy, shows a change in color or temperature of skin, is conscious or unconscious, or is convulsing.  Do not cause vomiting unless instructed by medical  personnel. Do not induce vomiting or force liquids into a person who is convulsing, unconscious, or very drowsy. Stay calm and in control.   Activated charcoal also is sometimes used in certain types of poisoning and you may wish to add a supply to your emergency medicines. It is available without a prescription. Call a poison control center before using this medication. PREVENTION  Thousands of children die every year from unintentional poisoning. This may be from household chemicals, poisoning from carbon monoxide in a car, taking their parent's medications, or simply taking a few iron pills or vitamins with iron. Poisoning comes from unexpected sources.  Store medicines out of the sight and reach of children, preferably in a locked cabinet. Do not keep medications in a food cabinet. Always store your medicines in a secure place. Get rid of expired medications.  If you have children living with you or have them as occasional guests, you should have child-resistant caps on your medicine containers. Keep everything out of reach. Child proof your home.  If you are called to the telephone or to answer the door while you are taking a medicine, take the container with you or put the medicine out of the reach of small children.  Do not take your medication in front of children. Do not tell your child how good a medication is and how good it is for them. They may get the idea it is more of a treat.  If you are an adult and have accidentally taken an overdose, you need to consider how this happened and what can be done to prevent it from happening again. If this was from a street drug or alcohol, determine if there is a problem that needs addressing. If you are not sure a problems exists, it is easy to talk to a professional and ask them if they think you have a problem. It is better to handle this problem in this way before it happens again and has a much worse consequence. Document Released: 08/19/2004  Document Revised: 08/28/2011 Document Reviewed: 01/25/2009 [facility] Patient Information 2015 [place], [place]. This information is not intended to replace advice given to you by your health care provider. Make sure you discuss any questions you have with your health care provider.  Narcotic Overdose A narcotic overdose is the misuse or overuse of a narcotic drug. A narcotic overdose can make you pass out and stop breathing. If you are not treated right away, this can cause permanent brain damage or stop your heart. Medicine may be given to reverse the effects of an overdose. If so, this medicine may bring on withdrawal symptoms. The symptoms may be abdominal cramps, throwing up (vomiting), sweating, chills, and nervousness. Injecting narcotics can cause more problems than just an overdose. AIDS, hepatitis, and other very serious infections are transmitted by sharing needles and syringes. If you decide to quit using, there are medicines which can help you through the withdrawal period. Trying to quit all at once on your own can be uncomfortable, but not life-threatening. Call your caregiver, Narcotics Anonymous, or any drug and alcohol treatment program for further help.  Document Released: 07/13/2004 Document Revised: 08/28/2011 Document Reviewed: 05/07/2009 [facility] Patient Information 2015 [place], [place].  This information is not intended to replace advice given to you by your health care provider. Make sure you discuss any questions you have with your health care provider. ° °

## 2014-01-16 NOTE — ED Notes (Signed)
Bed: WA18 Expected date:  Expected time:  Means of arrival:  Comments: EMS-OD 

## 2014-01-22 ENCOUNTER — Emergency Department (HOSPITAL_COMMUNITY)
Admission: EM | Admit: 2014-01-22 | Discharge: 2014-01-22 | Disposition: A | Discharge status: Home / Self Care | Payer: Self-pay | Attending: Emergency Medicine | Admitting: Emergency Medicine

## 2014-01-22 ENCOUNTER — Emergency Department (HOSPITAL_COMMUNITY): Payer: Self-pay

## 2014-01-22 ENCOUNTER — Encounter (HOSPITAL_COMMUNITY): Payer: Self-pay | Admitting: Emergency Medicine

## 2014-01-22 DIAGNOSIS — F17200 Nicotine dependence, unspecified, uncomplicated: Secondary | ICD-10-CM | POA: Insufficient documentation

## 2014-01-22 DIAGNOSIS — S62308A Unspecified fracture of other metacarpal bone, initial encounter for closed fracture: Secondary | ICD-10-CM

## 2014-01-22 DIAGNOSIS — S6990XA Unspecified injury of unspecified wrist, hand and finger(s), initial encounter: Secondary | ICD-10-CM | POA: Insufficient documentation

## 2014-01-22 DIAGNOSIS — Y9389 Activity, other specified: Secondary | ICD-10-CM | POA: Insufficient documentation

## 2014-01-22 DIAGNOSIS — Y929 Unspecified place or not applicable: Secondary | ICD-10-CM | POA: Insufficient documentation

## 2014-01-22 DIAGNOSIS — S62319A Displaced fracture of base of unspecified metacarpal bone, initial encounter for closed fracture: Secondary | ICD-10-CM | POA: Insufficient documentation

## 2014-01-22 DIAGNOSIS — W2209XA Striking against other stationary object, initial encounter: Secondary | ICD-10-CM | POA: Insufficient documentation

## 2014-01-22 MED ORDER — OXYCODONE-ACETAMINOPHEN 5-325 MG PO TABS
2.0000 | ORAL_TABLET | Freq: Once | ORAL | Status: AC
  Administered 2014-01-22: 2 via ORAL
  Filled 2014-01-22: qty 2

## 2014-01-22 MED ORDER — OXYCODONE-ACETAMINOPHEN 5-325 MG PO TABS: 2.0000 | ORAL_TABLET | ORAL | Status: DC | PRN
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
## 2014-01-22 NOTE — Discharge Instructions (Signed)
Wear wrist splint as applied.  Ice for 20 minutes every 2 hours for the next 2 days while awake.  Followup with Dr. Romeo Beck in the next few days. The contact information has been provided in this discharge summary.  Percocet as prescribed as needed for pain.   Hand Fracture, Metacarpals Fractures of metacarpals are breaks in the bones of the hand. They extend from the knuckles to the wrist. These bones can undergo many types of fractures. There are different ways of treating these fractures, all of which may be correct. TREATMENT  Hand fractures can be treated with:   Non-reduction - The fracture is casted without changing the positions of the fracture (bone pieces) involved. This fracture is usually left in a cast for 4 to 6 weeks or as your caregiver thinks necessary.  Closed reduction - The bones are moved back into position without surgery and then casted.  ORIF (open reduction and internal fixation) - The fracture site is opened and the bone pieces are fixed into place with some type of hardware, such as screws, etc. They are then casted. Your caregiver will discuss the type of fracture you have and the treatment that should be best for that problem. If surgery is chosen, let your caregivers know about the following.  LET YOUR CAREGIVERS KNOW ABOUT:  Allergies.  Medications you are taking, including herbs, eye drops, over the counter medications, and creams.  Use of steroids (by mouth or creams).  Previous problems with anesthetics or novocaine.  Possibility of pregnancy.  History of blood clots (thrombophlebitis).  History of bleeding or blood problems.  Previous surgeries.  Other health problems. AFTER THE PROCEDURE After surgery, you will be taken to the recovery area where a nurse will watch and check your progress. Once you are awake, stable, and taking fluids well, barring other problems, you'll be allowed to go home. Once home, an ice pack applied to your  operative site may help with pain and keep the swelling down. HOME CARE INSTRUCTIONS   Follow your caregiver's instructions as to activities, exercises, physical therapy, and driving a car.  Daily exercise is helpful for keeping range of motion and strength. Exercise as instructed.  To lessen swelling, keep the injured hand elevated above the level of your heart as much as possible.  Apply ice to the injury for 15-20 minutes each hour while awake for the first 2 days. Put the ice in a plastic bag and place a thin towel between the bag of ice and your cast.  Move the fingers of your casted hand several times a day.  If a plaster or fiberglass cast was applied:  Do not try to scratch the skin under the cast using a sharp or pointed object.  Check the skin around the cast every day. You may put lotion on red or sore areas.  Keep your cast dry. Your cast can be protected during bathing with a plastic bag. Do not put your cast into the water.  If a plaster splint was applied:  Wear your splint for as long as directed by your caregiver or until seen again.  Do not get your splint wet. Protect it during bathing with a plastic bag.  You may loosen the elastic bandage around the splint if your fingers start to get numb, tingle, get cold or turn blue.  Do not put pressure on your cast or splint; this may cause it to break. Especially, do not lean plaster casts on hard surfaces for  24 hours after application.  Take medications as directed by your caregiver.  Only take over-the-counter or prescription medicines for pain, discomfort, or fever as directed by your caregiver.  Follow-up as provided by your caregiver. This is very important in order to avoid permanent injury or disability and chronic pain. SEEK MEDICAL CARE IF:   Increased bleeding (more than a small spot) from beneath your cast or splint if there is beneath the cast as with an open reduction.  Redness, swelling, or increasing  pain in the wound or from beneath your cast or splint.  Pus coming from wound or from beneath your cast or splint.  An unexplained oral temperature above 102 F (38.9 C) develops, or as your caregiver suggests.  A foul smell coming from the wound or dressing or from beneath your cast or splint.  You have a problem moving any of your fingers. SEEK IMMEDIATE MEDICAL CARE IF:   You develop a rash  You have difficulty breathing  You have any allergy problems If you do not have a window in your cast for observing the wound, a discharge or minor bleeding may show up as a stain on the outside of your cast. Report these findings to your caregiver. MAKE SURE YOU:   Understand these instructions.  Will watch your condition.  Will get help right away if you are not doing well or get worse. Document Released: 06/05/2005 Document Revised: 08/28/2011 Document Reviewed: 01/23/2008 [facility] Patient Information 2015 [place]. This information is not intended to replace advice given to you by your health care provider. Make sure you discuss any questions you have with your health care provider.

## 2014-01-26 ENCOUNTER — Telehealth: Payer: Self-pay | Admitting: Orthopedic Surgery

## 2014-01-26 NOTE — Telephone Encounter (Signed)
[facility] for problem of fracture of right hand.  Offered appointment, although patient states he is self-pay, and may be unable to bring the payment discussed as minimum amount; also discussed options.  He will try to work out and call back -- follow up regarding appointment if no return call.

## 2014-02-01 ENCOUNTER — Emergency Department (HOSPITAL_COMMUNITY)
Admission: EM | Admit: 2014-02-01 | Discharge: 2014-02-02 | Disposition: A | Discharge status: Home / Self Care | Payer: Self-pay | Attending: Emergency Medicine | Admitting: Emergency Medicine

## 2014-02-01 ENCOUNTER — Encounter (HOSPITAL_COMMUNITY): Payer: Self-pay | Admitting: Emergency Medicine

## 2014-02-01 DIAGNOSIS — F17200 Nicotine dependence, unspecified, uncomplicated: Secondary | ICD-10-CM | POA: Insufficient documentation

## 2014-02-01 DIAGNOSIS — F919 Conduct disorder, unspecified: Secondary | ICD-10-CM | POA: Insufficient documentation

## 2014-02-01 DIAGNOSIS — F10920 Alcohol use, unspecified with intoxication, uncomplicated: Secondary | ICD-10-CM

## 2014-02-01 DIAGNOSIS — F1010 Alcohol abuse, uncomplicated: Secondary | ICD-10-CM | POA: Insufficient documentation

## 2014-02-01 LAB — RAPID URINE DRUG SCREEN, HOSP PERFORMED
Amphetamines: NOT DETECTED
Barbiturates: NOT DETECTED
Benzodiazepines: NOT DETECTED
Cocaine: POSITIVE — AB
OPIATES: NOT DETECTED
Tetrahydrocannabinol: NOT DETECTED

## 2014-02-01 LAB — COMPREHENSIVE METABOLIC PANEL
ALT: 65 U/L — AB (ref 0–53)
AST: 83 U/L — ABNORMAL HIGH (ref 0–37)
Albumin: 4.3 g/dL (ref 3.5–5.2)
Alkaline Phosphatase: 72 U/L (ref 39–117)
Anion gap: 17 — ABNORMAL HIGH (ref 5–15)
BUN: 5 mg/dL — ABNORMAL LOW (ref 6–23)
CALCIUM: 9.6 mg/dL (ref 8.4–10.5)
CO2: 24 mEq/L (ref 19–32)
CREATININE: 0.87 mg/dL (ref 0.50–1.35)
Chloride: 99 mEq/L (ref 96–112)
GFR calc non Af Amer: >90 mL/min (ref >90)
GLUCOSE: 98 mg/dL (ref 70–99)
Potassium: 4.2 mEq/L (ref 3.7–5.3)
Sodium: 140 mEq/L (ref 137–147)
Total Bilirubin: 0.3 mg/dL (ref 0.3–1.2)
Total Protein: 7.6 g/dL (ref 6.0–8.3)

## 2014-02-01 LAB — CBC
HCT: 42.6 % (ref 39.0–52.0)
HEMOGLOBIN: 14.3 g/dL (ref 13.0–17.0)
MCH: 29.7 pg (ref 26.0–34.0)
MCHC: 33.6 g/dL (ref 30.0–36.0)
MCV: 88.6 fL (ref 78.0–100.0)
Platelets: 267 10*3/uL (ref 150–400)
RBC: 4.81 MIL/uL (ref 4.22–5.81)
RDW: 13.8 % (ref 11.5–15.5)
WBC: 7.9 10*3/uL (ref 4.0–10.5)

## 2014-02-01 LAB — SALICYLATE LEVEL

## 2014-02-01 LAB — ETHANOL: ALCOHOL ETHYL (B): 299 mg/dL — AB (ref 0–11)

## 2014-02-01 LAB — ACETAMINOPHEN LEVEL: Acetaminophen (Tylenol), Serum: <15 ug/mL (ref 10–30)
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
## 2014-02-01 NOTE — ED Notes (Signed)
Patient belongings placed in 1 patient belonging bag, secured at the nurses station at this time.

## 2014-02-01 NOTE — ED Notes (Signed)
Patient escorted to restroom to change and obtain urine sample.
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
## 2014-02-01 NOTE — ED Notes (Signed)
Ortho tech paged for splint application 
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
## 2014-02-01 NOTE — ED Notes (Signed)
[facility] Expected date:  Expected time:  Means of arrival:  Comments: EMS 38yo M, SI / withdrawl

## 2014-02-02 MED ORDER — ALUM & MAG HYDROXIDE-SIMETH 200-200-20 MG/5ML PO SUSP: 30.0000 mL | ORAL | Status: DC | PRN

## 2014-02-02 MED ORDER — NICOTINE 21 MG/24HR TD PT24
21.0000 mg | MEDICATED_PATCH | Freq: Every day | TRANSDERMAL | Status: DC
  Administered 2014-02-02: 21 mg via TRANSDERMAL
  Filled 2014-02-02: qty 1

## 2014-02-02 MED ORDER — ONDANSETRON HCL 4 MG PO TABS: 4.0000 mg | ORAL_TABLET | Freq: Three times a day (TID) | ORAL | Status: DC | PRN

## 2014-02-02 MED ORDER — ZOLPIDEM TARTRATE 5 MG PO TABS: 5.0000 mg | ORAL_TABLET | Freq: Every evening | ORAL | Status: DC | PRN

## 2014-02-02 MED ORDER — LORAZEPAM 1 MG PO TABS: 0.0000 mg | ORAL_TABLET | Freq: Two times a day (BID) | ORAL | Status: DC

## 2014-02-02 MED ORDER — VITAMIN B-1 100 MG PO TABS
100.0000 mg | ORAL_TABLET | Freq: Every day | ORAL | Status: DC
Start: 2014-02-02 — End: 2014-02-02
  Administered 2014-02-02: 100 mg via ORAL
  Filled 2014-02-02: qty 1

## 2014-02-02 MED ORDER — IBUPROFEN 200 MG PO TABS
600.0000 mg | ORAL_TABLET | Freq: Three times a day (TID) | ORAL | Status: DC | PRN
  Administered 2014-02-02: 600 mg via ORAL
  Filled 2014-02-02: qty 3

## 2014-02-02 MED ORDER — THIAMINE HCL 100 MG/ML IJ SOLN: 100.0000 mg | Freq: Every day | INTRAMUSCULAR | Status: DC

## 2014-02-02 MED ORDER — LORAZEPAM 1 MG PO TABS
1.0000 mg | ORAL_TABLET | Freq: Three times a day (TID) | ORAL | Status: DC | PRN
  Administered 2014-02-02: 1 mg via ORAL
  Filled 2014-02-02: qty 1

## 2014-02-02 MED ORDER — LORAZEPAM 2 MG/ML IJ SOLN: 0.0000 mg | Freq: Two times a day (BID) | INTRAMUSCULAR | Status: DC

## 2014-02-02 MED ORDER — LORAZEPAM 2 MG/ML IJ SOLN: 0.0000 mg | Freq: Four times a day (QID) | INTRAMUSCULAR | Status: DC

## 2014-02-02 MED ORDER — ACETAMINOPHEN 325 MG PO TABS: 650.0000 mg | ORAL_TABLET | ORAL | Status: DC | PRN

## 2014-02-02 MED ORDER — LORAZEPAM 1 MG PO TABS: 0.0000 mg | ORAL_TABLET | Freq: Four times a day (QID) | ORAL | Status: DC

## 2014-02-02 MED ORDER — DIAZEPAM 5 MG PO TABS: 5.0000 mg | ORAL_TABLET | Freq: Two times a day (BID) | ORAL | Status: DC

## 2014-02-02 NOTE — ED Notes (Signed)
TTS in place, consult in conference room.

## 2014-02-02 NOTE — ED Notes (Signed)
Patient resting quietly, eyes closed, chest observed for rise and fall.  

## 2014-02-02 NOTE — BH Assessment (Signed)
Assessment completed. Inpatient treatment has been recommended. Sabino DickGail Shulz, NP has been notified of the recommendation. TTS will contact other facilities for placement.

## 2014-02-02 NOTE — BH Assessment (Signed)
Assessment Note  Anthony Beck is an 38 y.o. male requesting alcohol detox. Pt reported that he has been drinking heavily since injuring his arm. Pt denies SI, HI and AVH at this time. Pt did not report any previous suicide attempts or psychiatric hospitalizations. Pt reported that he completed a detox program at [facility] but is unsure of the longest amount of time he can maintain his sobriety. Pt is endorsing some depressive symptoms and shared that his appetite and sleep have been poor. Pt denied having access to weapons. Pt reported that he has an upcoming court date for felony larceny; however he is unsure of the actual court date. Pt reported that he drinks alcohol daily and denies any illicit substance use; however his UDS is positive for cocaine. PT also reported that he accidentally overdosed on heroine approximately two weeks ago. Pt did not report any physical, sexual or emotional abuse at this time. Pt reported that he lives with his mother and she is a part of his support system.   Axis I: Alcohol intoxication with moderate or severe use Axis II: Deferred Axis III:  Past Medical History  Diagnosis Date  . Knee joint pain   . Alcohol abuse    Axis IV: problems related to legal system/crime Axis V: 41-50 serious symptoms  Past Medical History:  Past Medical History  Diagnosis Date  . Knee joint pain   . Alcohol abuse     Past Surgical History  Procedure Laterality Date  . Cleft palate repair      Family History:  Family History  Problem Relation Age of Onset  . Cancer Other     Social History:  reports that he has been smoking Cigarettes.  He has been smoking about 1.00 pack per day. He has never used smokeless tobacco. He reports that he drinks alcohol. He reports that he does not use illicit drugs.  Additional Social History:  Alcohol / Drug Use History of alcohol / drug use?: Yes Substance #1 Name of Substance 1: Alcohol 1 - Age of First Use: "8" 1 - Amount (size/oz):  "12pk to a case" 1 - Frequency: daily 1 - Duration: 6 months  1 - Last Use / Amount: 02-01-14 unknown   CIWA: CIWA-Ar BP: 144/83 mmHg Pulse Rate: 116 Nausea and Vomiting: no nausea and no vomiting Tactile Disturbances: none Tremor: no tremor Auditory Disturbances: not present Paroxysmal Sweats: no sweat visible Visual Disturbances: not present Anxiety: mildly anxious Headache, Fullness in Head: none present Agitation: normal activity Orientation and Clouding of Sensorium: oriented and can do serial additions CIWA-Ar Total: 1 COWS:    Allergies: No Known Allergies  Home Medications:  (Not in a hospital admission)  OB/GYN Status:  No LMP for male patient.  General Assessment Data Location of Assessment: [facility] ED Is this a Tele or Face-to-Face Assessment?: Face-to-Face Is this an Initial Assessment or a Re-assessment for this encounter?: Initial Assessment Living Arrangements: Parent Can pt return to current living arrangement?: Yes Admission Status: Voluntary Is patient capable of signing voluntary admission?: Yes Transfer from: Home Referral Source: Self[facility]Family/Friend     [facility] Living Arrangements: Parent Name of Psychiatrist: None reported Name of Therapist: None reported  Education Status Is patient currently in school?: No Current Grade: NA Highest grade of school patient has completed: NA Name of school: NA Contact person: NA  Risk to self with the past 6 months Suicidal Ideation: No Suicidal Intent: No Is patient at risk for suicide?: No Suicidal  Plan?: No Access to Means: No What has been your use of drugs/alcohol within the last 12 months?: Alcohol use daily Previous Attempts/Gestures: No How many times?: 0 Other Self Harm Risks: No other self harm risk identified at this time  Triggers for Past Attempts: None known Intentional Self Injurious Behavior: None Family Suicide History: No Recent stressful life event(s): Job Loss;Financial  Problems;Legal Issues Persecutory voices/beliefs?: No Depression: Yes Depression Symptoms: Despondent;Isolating;Feeling worthless/self pity;Feeling angry/irritable Substance abuse history and/or treatment for substance abuse?: Yes Suicide prevention information given to non-admitted patients: Not applicable  Risk to Others within the past 6 months Homicidal Ideation: No Thoughts of Harm to Others: No Current Homicidal Intent: No Current Homicidal Plan: No Access to Homicidal Means: No Identified Victim: NA History of harm to others?: No Assessment of Violence: None Noted Violent Behavior Description: No violent behaviors reported. Pt is calm and cooperative at this time. Does patient have access to weapons?: No Criminal Charges Pending?: Yes Describe Pending Criminal Charges: Anthony Beck  Does patient have a court date: Yes Court Date:  (Unknown)  Psychosis Hallucinations: None noted Delusions: None noted  Mental Status Report Appear/Hygiene: In scrubs Eye Contact: Poor Motor Activity: Freedom of movement Speech: Soft Level of Consciousness: Quiet/awake Mood: Depressed Affect: Appropriate to circumstance Anxiety Level: None Thought Processes: Coherent;Relevant Judgement: Unimpaired Orientation: Person;Situation Obsessive Compulsive Thoughts/Behaviors: None  Cognitive Functioning Concentration: Normal Memory: Recent Intact IQ: Average Insight: Fair Impulse Control: Poor Appetite: Poor Weight Loss: 0 Weight Gain: 0 Sleep: Decreased Total Hours of Sleep: 6 Vegetative Symptoms: None  ADLScreening [facility] Assessment Services) Patient's cognitive ability adequate to safely complete daily activities?: Yes Patient able to express need for assistance with ADLs?: Yes Independently performs ADLs?: Yes (appropriate for developmental age)  Prior Inpatient Therapy Prior Inpatient Therapy: Yes Prior Therapy Dates: 2014 Prior Therapy Facilty/Provider(s): [facility] Reason for  Treatment: detox  Prior Outpatient Therapy Prior Outpatient Therapy: No Prior Therapy Dates: NA Prior Therapy Facilty/Provider(s): NA Reason for Treatment: NA  ADL Screening (condition at time of admission) Patient's cognitive ability adequate to safely complete daily activities?: Yes Is the patient deaf or have difficulty hearing?: No Does the patient have difficulty seeing, even when wearing glasses/contacts?: No Does the patient have difficulty concentrating, remembering, or making decisions?: No Patient able to express need for assistance with ADLs?: Yes Does the patient have difficulty dressing or bathing?: No Independently performs ADLs?: Yes (appropriate for developmental age)       Abuse/Neglect Assessment (Assessment to be complete while patient is alone) Physical Abuse: Denies Verbal Abuse: Denies Sexual Abuse: Denies Exploitation of patient/patient's resources: Denies Self-Neglect: Denies Values / Beliefs Cultural Requests During Hospitalization: None Spiritual Requests During Hospitalization: None        Additional Information 1:1 In Past 12 Months?: No CIRT Risk: No Elopement Risk: No Does patient have medical clearance?: Yes     Disposition:  Disposition Initial Assessment Completed for this Encounter: Yes Disposition of Patient: Inpatient treatment program Type of inpatient treatment program: Adult  On Site Evaluation by:   Reviewed with Physician:    Arhan Mcmanamon S 02/02/2014 1:33 AM

## 2014-02-02 NOTE — ED Notes (Signed)
Patient is resting comfortably. 

## 2014-02-02 NOTE — ED Notes (Signed)
Patient resting quietly, NAD noted.

## 2014-02-02 NOTE — ED Notes (Signed)
Pt decided not to stay for alcohol detox.  Notified MD.  Pt was given resources by social work for outpatient.  To d/c home.

## 2014-02-02 NOTE — ED Notes (Signed)
Bed: WA08 Expected date:  Expected time:  Means of arrival:  Comments: 

## 2014-02-02 NOTE — Discharge Instructions (Signed)
Alcohol Withdrawal °Anytime drug use is interfering with normal living activities it has become abuse. This includes problems with family and friends. Psychological dependence has developed when your mind tells you that the drug is needed. This is usually followed by physical dependence when a continuing increase of drugs are required to get the same feeling or "high." This is known as addiction or chemical dependency. A person's risk is much higher if there is a history of chemical dependency in the family. °Mild Withdrawal Following Stopping Alcohol, When Addiction or Chemical Dependency Has Developed °When a person has developed tolerance to alcohol, any sudden stopping of alcohol can cause uncomfortable physical symptoms. Most of the time these are mild and consist of tremors in the hands and increases in heart rate, breathing, and temperature. Sometimes these symptoms are associated with anxiety, panic attacks, and bad dreams. There may also be stomach upset. Normal sleep patterns are often interrupted with periods of inability to sleep (insomnia). This may last for 6 months. Because of this discomfort, many people choose to continue drinking to get rid of this discomfort and to try to feel normal. °Severe Withdrawal with Decreased or No Alcohol Intake, When Addiction or Chemical Dependency Has Developed °About five percent of alcoholics will develop signs of severe withdrawal when they stop using alcohol. One sign of this is development of generalized seizures (convulsions). Other signs of this are severe agitation and confusion. This may be associated with believing in things which are not real or seeing things which are not really there (delusions and hallucinations). Vitamin deficiencies are usually present if alcohol intake has been long-term. Treatment for this most often requires hospitalization and close observation. °Addiction can only be helped by stopping use of all chemicals. This is hard but may  save your life. With continual alcohol use, possible outcomes are usually loss of self respect and esteem, violence, and death. °Addiction cannot be cured but it can be stopped. This often requires outside help and the care of professionals. Treatment centers are listed in the yellow pages under Cocaine, Narcotics, and Alcoholics Anonymous. Most hospitals and clinics can refer you to a specialized care center. °It is not necessary for you to go through the uncomfortable symptoms of withdrawal. Your caregiver can provide you with medicines that will help you through this difficult period. Try to avoid situations, friends, or drugs that made it possible for you to keep using alcohol in the past. Learn how to say no. °It takes a long period of time to overcome addictions to all drugs, including alcohol. There may be many times when you feel as though you want a drink. After getting rid of the physical addiction and withdrawal, you will have a lessening of the craving which tells you that you need alcohol to feel normal. Call your caregiver if more support is needed. Learn who to talk to in your family and among your friends so that during these periods you can receive outside help. Alcoholics Anonymous (AA) has helped many people over the years. To get further help, contact AA or call your caregiver, counselor, or clergyperson. Al-Anon and Alateen are support groups for friends and family members of an alcoholic. The people who love and care for an alcoholic often need help, too. For information about these organizations, check your phone directory or call a local alcoholism treatment center.  °SEEK IMMEDIATE MEDICAL CARE IF:  °· You have a seizure. °· You have a fever. °· You experience uncontrolled vomiting or you   vomit up blood. This may be bright red or look like black coffee grounds. °· You have blood in the stool. This may be bright red or appear as a black, tarry, bad-smelling stool. °· You become lightheaded or  faint. Do not drive if you feel this way. Have someone else drive you or call 911 for help. °· You become more agitated or confused. °· You develop uncontrolled anxiety. °· You begin to see things that are not really there (hallucinate). °Your caregiver has determined that you completely understand your medical condition, and that your mental state is back to normal. You understand that you have been treated for alcohol withdrawal, have agreed not to drink any alcohol for a minimum of 1 day, will not operate a car or other machinery for 24 hours, and have had an opportunity to ask any questions about your condition. °Document Released: 03/15/2005 Document Revised: 08/28/2011 Document Reviewed: 01/22/2008 °ExitCare® Patient Information ©2015 [employer]. This information is not intended to replace advice given to you by your health care provider. Make sure you discuss any questions you have with your health care provider. ° ° ° °Emergency Department Resource Guide °1) Find a Doctor and Pay Out of Pocket °Although you won't have to find out who is covered by your insurance plan, it is a good idea to ask around and get recommendations. You will then need to call the office and see if the doctor you have chosen will accept you as a new patient and what types of options they offer for patients who are self-pay. Some doctors offer discounts or will set up payment plans for their patients who do not have insurance, but you will need to ask so you aren't surprised when you get to your appointment. ° °2) Contact Your Local Health Department °Not all health departments have doctors that can see patients for sick visits, but many do, so it is worth a call to see if yours does. If you don't know where your local health department is, you can check in your phone book. The CDC also has a tool to help you locate your state's health department, and many state websites also have listings of all of their local health  departments. ° °3) Find a Walk-in Clinic °If your illness is not likely to be very severe or complicated, you may want to try a walk in clinic. These are popping up all over the country in pharmacies, drugstores, and shopping centers. They're usually staffed by nurse practitioners or physician assistants that have been trained to treat common illnesses and complaints. They're usually fairly quick and inexpensive. However, if you have serious medical issues or chronic medical problems, these are probably not your best option. ° °No Primary Care Doctor: °- Call [facility] at  832-8000 - they can help you locate a primary care doctor that  accepts your insurance, provides certain services, etc. °- Physician Referral Service- 1-800-533-3463 ° °Chronic Pain Problems: °Organization         Address  Phone   Notes  °[facility]  (336) 297-2271 Patients need to be referred by their primary care doctor.  ° °Medication Assistance: °Organization         Address  Phone   Notes  °[place] [facility] [address] °[place], [place] [address] (336) 641-8030 --Must be a resident of [place] °-- Must have NO insurance coverage whatsoever (no Medicaid/ Medicare, etc.) °-- The pt. MUST have   a primary care doctor that directs their care regularly and follows them in the community °  °[facility]  (866) 331-1348   °[employer]  (888) 892-1162   ° °Agencies that provide inexpensive medical care: °Organization         Address  Phone   Notes  °[facility]  (336) 832-8035   °[facility]    (336) 832-7272   °[facility] [address] °[place], [place] [address] (336) 832-4777   °[facility] [address], °[place] (336) 271-4999   °[facility]    (336) 373-0678   °[facility]    (336) 272-1050   °[facility] ° [address], [place] Phone:  (336)  832-4444, Fax:  (336) 832-4440 Hours of Operation:  9 am - 6 pm, M-F.  Also accepts Medicaid/Medicare and self-pay.  °[facility] ° [address], [place] Phone: (336) 832-3150, Fax: (336) 832-3151. Hours of Operation:  8:30 am - 5:30 pm, M-F.  Also accepts Medicaid and self-pay.  °[facility] [place] [address], [place] Phone: (336) 878-6027   °[facility] [address], [place], [place] (336)723-1848, Ext. 123 Mondays & Thursdays: 7-9 AM.  First 15 patients are seen on a first come, first serve basis. °  ° °Medicaid-accepting [place] Providers: ° °Organization         Address  Phone   Notes  °[facility] [address], [place] (336) 641-2100 Also accepts self-pay patients.  °[facility] [address], [place] ° (336) 856-9996   °[facility] [address], [place] (336) 288-8857   °[facility] [address] [place] [address], [place] (336) 299-7000   °Veita Bland [address], [place]  ° (336) 373-1557 Only accepts [place] Access Medicaid patients after they have their name applied to their card.  ° °Self-Pay (no insurance) in [place]: ° °Organization         Address  Phone   Notes  °Sickle Cell Patients, [facility] [address], [place] (336) 832-1970   °[facility] [address], [place] (336) 832-4400   ° [facility] ° [address], [place] (336) 992-4800   °[facility]/Dr. Osei-Bonsu ° [address] [place] [address], [place] or [address], [place] (336) 841-8500 Phone number for both [place] and [place] locations is the same.  °[facility] [address], [place] (336) 299-0000   °[facility] [address] [place] [address], [place] or [address] (336)  852-7530 °(336) 878-2260   °[facility] [address], [place] (336) 350-1642, phone; (336) 294-5005, fax Sees patients 1st and 3rd Saturday of every month.  Must not qualify for public or private insurance (i.e. Medicaid, Medicare, [place] Health Choice, Veterans' Benefits) • Household income should be no more than 200% of the poverty level •The clinic cannot treat you if you are pregnant or think you are pregnant • Sexually transmitted diseases are not treated at the clinic.  ° ° °Dental Care: °Organization         Address  Phone  Notes  °[facility] [address], [place] (  336) 641-6152 Accepts children up to age 21 who are enrolled in Medicaid or [place] Health Choice; pregnant women with a Medicaid card; and children who have applied for Medicaid or [place] Health Choice, but were declined, whose parents can pay a reduced fee at time of service.  °[facility] [place]  [address], [place] (336) 641-7733 Accepts children up to age 21 who are enrolled in Medicaid or [place] Health Choice; pregnant women with a Medicaid card; and children who have applied for Medicaid or [place] Health Choice, but were declined, whose parents can pay a reduced fee at time of service.  °[facility] ° [address], [place] (336) 641-4533 Patients are seen by appointment only. Walk-ins are not accepted. [facility] will see patients 18 years of age and older. °Monday - Tuesday (8am-5pm) °Most Wednesdays (8:30-5pm) °$30 per visit, cash only  °[facility] ° [address], [place] (336) 641-4533 Patients are seen by appointment only. Walk-ins are not accepted. [facility] will see patients 18 years of age and older. °One Wednesday Evening (Monthly: Volunteer Based).  $30 per visit, cash only  °[facility]  (919) 537-3737 for adults;  Children under age 4, call Graduate Pediatric Dentistry at (919) [phone]. Children aged 4-14, please call (919) [phone] to request a pediatric application. ° Dental services are provided in all areas of dental care including fillings, crowns and bridges, complete and partial dentures, implants, gum treatment, root canals, and extractions. Preventive care is also provided. Treatment is provided to both adults and children. °Patients are selected via a lottery and there is often a waiting list. °  °[facility] [address], °[place] ° (336) 763-8833 [email or web address] °  °[facility] [address], [place], [place] (336)723-1848, Ext. 123 Second and Fourth Thursday of each month, opens at 6:30 AM; Clinic ends at 9 AM.  Patients are seen on a first-come first-served basis, and a limited number are seen during each clinic.  ° °[facility] ° [address], [place], [place] (336) 723-7904   Eligibility Requirements °You must have lived in [place], [place], or [place] counties for at least the last three months. °  You cannot be eligible for state or federal sponsored healthcare insurance, including Veterans Administration, Medicaid, or Medicare. °  You generally cannot be eligible for healthcare insurance through your employer.  °  How to apply: °Eligibility screenings are held every Tuesday and Wednesday afternoon from 1:00 pm until 4:00 pm. You do not need an appointment for the interview!  °[facility] [address], [place], [place] 336-631-2330   °[facility]  336-342-8273   °[place]  336-703-3100   ° [facility]  336-570-6415   ° °Behavioral Health Resources in the Community: °Intensive Outpatient Programs °Organization         Address  Phone  Notes  °[facility] [address], [place], [place] 336-878-6098   °[facility] [address], [place], [place] 336-832-9800   °[facility] [address], [place], [place] ° 336-882-2125   °[facility] [address],  °[place], [place] 1-800-853-5163 or 336-641-4981   °Substance Abuse Resources °Organization         Address  Phone  Notes  °[facility]    336-882-2125   °[facility]  336-784-9470   °[facility]  336-285-9073   °[facility]  336-845-3988   °Residential & Outpatient Substance Abuse Program  1-800-659-3381   °Psychological Services °Organization         Address  Phone  Notes  °[facility]  336- 832-9600   °[facility]  336- 378-7881   °[facility] [address], [place] 1-800-853-5163 or 336-641-4981   ° °Mobile Crisis Teams °Organization         Address  Phone  Notes  °[facility], Mobile Crisis Care Unit  1-877-626-1772   °Assertive °[facility] ° [address] [place], [place] 336-834-9664   °Sharon DeEsch [address] °[place] 336-554-5454   ° °Self-Help/Support Groups °Organization         Address  Phone             Notes  °[facility] - variety of support groups  336- 373-1402 Call for more information  °Narcotics Anonymous (NA), [facility] [address], °[place]  2 meetings at this location  ° °Residential Treatment Programs °Organization         Address  Phone  Notes  °[facility] [address],    °[place]  1-866-801-8205   °[facility] ° [address], [place], [place] 704-293-8524   °[facility] [address], [place] 336-845-3988 Admissions: 8am-3pm M-F  °[facility] [address]    °[place], [place] 336-841-1104   °[facility] [address], [place], [place] 336-379-7146   °[facility] [address]  °[place], [place] 336-285-9073   °[facility] - Intensive  Outpatient [address], [place], [place] 336-852-3033   °[facility] [address]  °[place], [place] 1-877-615-2722 or 336-784-9470   °[facility] ([facility]) [address] [place], [place] 336-227-7417 Accepts Medicaid  °[facility] [address]  °[place] 1-800-659-3381 Substance Abuse/Addiction Treatment  ° °[place] Behavioral Health Resources °Organization         Address  Phone  Notes  °[facility]  (888) 581-9988   °Julie Brannon, PhD [address] [place], [place]   (336) 349-5553 or (336) 951-0000   °[facility]   [address] °[place], [place] (336) 349-4454   °[facility] [address], [place], [place] (336) 342-8316 Insurance/Medicaid/sponsorship through [facility]  °[facility] [address]                                    [place], [place] (336) 342-8316 Therapy/tele-psych/case  °[facility] [address]  ° [place], [place] (336) 349-2233    °Dr. Arfeen  (336) 349-4544   °[facility] [place]  [employer] [place] [facility] 1) [address], [place] °2) [address], [place] °3)  [address], [place] (336) 349-3220 °(336) 342-7768 ° °(336) 342-8140   °[place] [facility] (336) 342-1394 or (336) 342-3537 (After Hours)    ° ° ° °

## 2014-02-02 NOTE — ED Notes (Signed)
Pt states he wants to leave. EDP and social work informed

## 2014-02-02 NOTE — ED Notes (Signed)
One bag pt belongings placed in locker 30

## 2014-02-02 NOTE — Clinical Social Work Psychosocial (Signed)
Clinical Social Work Department BRIEF PSYCHOSOCIAL ASSESSMENT 02/02/2014  Patient:  Anthony Beck,Anthony Beck     Account Number:  [id number]     Admit date:  02/01/2014  Clinical Social Worker:  Robin SearingALDWELL,Marena Witts, LCSWA  Date/Time:  02/02/2014 10:20 AM  Referred by:  Physician  Date Referred:  02/02/2014 Referred for  Substance Abuse   Other Referral:   Interview type:  Patient Other interview type:    PSYCHOSOCIAL DATA Living Status:  FAMILY Admitted from facility:   Level of care:   Primary support name:  mom Primary support relationship to patient:  FAMILY Degree of support available:   good    CURRENT CONCERNS Current Concerns  Substance Abuse   Other Concerns:    SOCIAL WORK ASSESSMENT / PLAN Asked to see patient for substance abuse resources as he is being discharged to home. Patient sitting on bed in hallway with casted arm- Patient reports drinking heavily "for a while" and acknowledges that it has gotten out of hand- He has been to [facility] in the past- he is planning to return home with his mother and at this time is receptive to outpatient resources.  He reports drinking beer and liquor as well as using cocaine.  Patient was not very engaged during my visit- poor eye contact, etc.  He states he will plan to follow up for services as an outpatient and CSW has encouraged him to do so as soon as he can.   Assessment/plan status:  No Further Intervention Required Other assessment/ plan:   Information/referral to community resources:   Outpatient Substance Abuse resources    PATIENT'S/FAMILY'S RESPONSE TO PLAN OF CARE: Patient accepting of CSW visit and resources provided- he plans to dc back home with mom and follow up on his own for outpatient substance abuse-   Reece LevyJanet Lyal Husted, MSW, [employer]CSWA [phone]

## 2014-02-04 NOTE — ED Provider Notes (Signed)
Medical screening examination/treatment/procedure(s) were performed by non-physician practitioner and as supervising physician I was immediately available for consultation/collaboration.   Linwood DibblesJon Susette Seminara, MD 02/04/14 [phone]

## 2014-02-17 NOTE — Telephone Encounter (Signed)
No further response or call back from patient ( I had attempted to call back 01/27/14)

## 2014-05-14 ENCOUNTER — Emergency Department (HOSPITAL_COMMUNITY)
Admission: EM | Admit: 2014-05-14 | Discharge: 2014-05-14 | Disposition: A | Discharge status: Home / Self Care | Payer: Self-pay | Attending: Emergency Medicine | Admitting: Emergency Medicine

## 2014-05-14 ENCOUNTER — Encounter (HOSPITAL_COMMUNITY): Payer: Self-pay | Admitting: Emergency Medicine

## 2014-05-14 DIAGNOSIS — F10120 Alcohol abuse with intoxication, uncomplicated: Secondary | ICD-10-CM | POA: Insufficient documentation

## 2014-05-14 DIAGNOSIS — F1410 Cocaine abuse, uncomplicated: Secondary | ICD-10-CM | POA: Insufficient documentation

## 2014-05-14 DIAGNOSIS — Z79899 Other long term (current) drug therapy: Secondary | ICD-10-CM | POA: Insufficient documentation

## 2014-05-14 DIAGNOSIS — F1210 Cannabis abuse, uncomplicated: Secondary | ICD-10-CM | POA: Insufficient documentation

## 2014-05-14 DIAGNOSIS — Z720 Tobacco use: Secondary | ICD-10-CM | POA: Insufficient documentation

## 2014-05-14 DIAGNOSIS — F10920 Alcohol use, unspecified with intoxication, uncomplicated: Secondary | ICD-10-CM

## 2014-05-14 LAB — ETHANOL: Alcohol, Ethyl (B): 256 mg/dL — ABNORMAL HIGH (ref 0–11)

## 2014-05-14 LAB — RAPID URINE DRUG SCREEN, HOSP PERFORMED
AMPHETAMINES: NOT DETECTED
BARBITURATES: NOT DETECTED
Benzodiazepines: NOT DETECTED
COCAINE: POSITIVE — AB
Opiates: NOT DETECTED
TETRAHYDROCANNABINOL: POSITIVE — AB

## 2014-05-14 LAB — CBC WITH DIFFERENTIAL/PLATELET
BASOS ABS: 0.1 10*3/uL (ref 0.0–0.1)
Basophils Relative: 1 % (ref 0–1)
EOS PCT: 1 % (ref 0–5)
Eosinophils Absolute: 0.1 10*3/uL (ref 0.0–0.7)
HCT: 40.1 % (ref 39.0–52.0)
Hemoglobin: 13.3 g/dL (ref 13.0–17.0)
LYMPHS ABS: 2.2 10*3/uL (ref 0.7–4.0)
LYMPHS PCT: 28 % (ref 12–46)
MCH: 29.6 pg (ref 26.0–34.0)
MCHC: 33.2 g/dL (ref 30.0–36.0)
MCV: 89.1 fL (ref 78.0–100.0)
Monocytes Absolute: 0.8 10*3/uL (ref 0.1–1.0)
Monocytes Relative: 10 % (ref 3–12)
Neutro Abs: 4.6 10*3/uL (ref 1.7–7.7)
Neutrophils Relative %: 60 % (ref 43–77)
Platelets: 286 10*3/uL (ref 150–400)
RBC: 4.5 MIL/uL (ref 4.22–5.81)
RDW: 14.2 % (ref 11.5–15.5)
WBC: 7.8 10*3/uL (ref 4.0–10.5)

## 2014-05-14 LAB — COMPREHENSIVE METABOLIC PANEL
ALK PHOS: 78 U/L (ref 39–117)
ALT: 115 U/L — AB (ref 0–53)
AST: 103 U/L — AB (ref 0–37)
Albumin: 3.9 g/dL (ref 3.5–5.2)
Anion gap: 16 — ABNORMAL HIGH (ref 5–15)
BUN: 10 mg/dL (ref 6–23)
CALCIUM: 9.1 mg/dL (ref 8.4–10.5)
CO2: 24 meq/L (ref 19–32)
Chloride: 102 mEq/L (ref 96–112)
Creatinine, Ser: 0.78 mg/dL (ref 0.50–1.35)
GFR calc Af Amer: >90 mL/min (ref >90)
Glucose, Bld: 88 mg/dL (ref 70–99)
POTASSIUM: 4.1 meq/L (ref 3.7–5.3)
SODIUM: 142 meq/L (ref 137–147)
Total Bilirubin: <0.2 mg/dL — ABNORMAL LOW (ref 0.3–1.2)
Total Protein: 7.1 g/dL (ref 6.0–8.3)

## 2014-05-14 NOTE — ED Notes (Signed)
Patient sleeping unable to get urine sample.

## 2014-05-14 NOTE — Discharge Instructions (Signed)
Alcohol Intoxication °Alcohol intoxication occurs when the amount of alcohol that a person has consumed impairs his or her ability to mentally and physically function. Alcohol directly impairs the normal chemical activity of the brain. Drinking large amounts of alcohol can lead to changes in mental function and behavior, and it can cause many physical effects that can be harmful.  °Alcohol intoxication can range in severity from mild to very severe. Various factors can affect the level of intoxication that occurs, such as the person's age, gender, weight, frequency of alcohol consumption, and the presence of other medical conditions (such as diabetes, seizures, or heart conditions). Dangerous levels of alcohol intoxication may occur when people drink large amounts of alcohol in a short period (binge drinking). Alcohol can also be especially dangerous when combined with certain prescription medicines or "recreational" drugs. °SIGNS AND SYMPTOMS °Some common signs and symptoms of mild alcohol intoxication include: °· Loss of coordination. °· Changes in mood and behavior. °· Impaired judgment. °· Slurred speech. °As alcohol intoxication progresses to more severe levels, other signs and symptoms will appear. These may include: °· Vomiting. °· Confusion and impaired memory. °· Slowed breathing. °· Seizures. °· Loss of consciousness. °DIAGNOSIS  °Your health care provider will take a medical history and perform a physical exam. You will be asked about the amount and type of alcohol you have consumed. Blood tests will be done to measure the concentration of alcohol in your blood. In many places, your blood alcohol level must be lower than 80 mg/dL (0.08%) to legally drive. However, many dangerous effects of alcohol can occur at much lower levels.  °TREATMENT  °People with alcohol intoxication often do not require treatment. Most of the effects of alcohol intoxication are temporary, and they go away as the alcohol naturally  leaves the body. Your health care provider will monitor your condition until you are stable enough to go home. Fluids are sometimes given through an IV access tube to help prevent dehydration.  °HOME CARE INSTRUCTIONS °· Do not drive after drinking alcohol. °· Stay hydrated. Drink enough water and fluids to keep your urine clear or pale yellow. Avoid caffeine.   °· Only take over-the-counter or prescription medicines as directed by your health care provider.   °SEEK MEDICAL CARE IF:  °· You have persistent vomiting.   °· You do not feel better after a few days. °· You have frequent alcohol intoxication. Your health care provider can help determine if you should see a substance use treatment counselor. °SEEK IMMEDIATE MEDICAL CARE IF:  °· You become shaky or tremble when you try to stop drinking.   °· You shake uncontrollably (seizure).   °· You throw up (vomit) blood. This may be bright red or may look like black coffee grounds.   °· You have blood in your stool. This may be bright red or may appear as a black, tarry, bad smelling stool.   °· You become lightheaded or faint.   °MAKE SURE YOU:  °· Understand these instructions. °· Will watch your condition. °· Will get help right away if you are not doing well or get worse. °Document Released: 03/15/2005 Document Revised: 02/05/2013 Document Reviewed: 11/08/2012 °ExitCare® Patient Information ©2015 [employer]. This information is not intended to replace advice given to you by your health care provider. Make sure you discuss any questions you have with your health care provider. ° ° °Emergency Department Resource Guide °1) Find a Doctor and Pay Out of Pocket °Although you won't have to find out who is covered by your   insurance plan, it is a good idea to ask around and get recommendations. You will then need to call the office and see if the doctor you have chosen will accept you as a new patient and what types of options they offer for patients who are self-pay.  Some doctors offer discounts or will set up payment plans for their patients who do not have insurance, but you will need to ask so you aren't surprised when you get to your appointment. ° °2) Contact Your Local Health Department °Not all health departments have doctors that can see patients for sick visits, but many do, so it is worth a call to see if yours does. If you don't know where your local health department is, you can check in your phone book. The CDC also has a tool to help you locate your state's health department, and many state websites also have listings of all of their local health departments. ° °3) Find a Walk-in Clinic °If your illness is not likely to be very severe or complicated, you may want to try a walk in clinic. These are popping up all over the country in pharmacies, drugstores, and shopping centers. They're usually staffed by nurse practitioners or physician assistants that have been trained to treat common illnesses and complaints. They're usually fairly quick and inexpensive. However, if you have serious medical issues or chronic medical problems, these are probably not your best option. ° °No Primary Care Doctor: °- Call [facility] at  832-8000 - they can help you locate a primary care doctor that  accepts your insurance, provides certain services, etc. °- Physician Referral Service- 1-800-533-3463 ° °Chronic Pain Problems: °Organization         Address  Phone   Notes  °[facility]  (336) 297-2271 Patients need to be referred by their primary care doctor.  ° °Medication Assistance: °Organization         Address  Phone   Notes  °[place] [facility] [address] °[place], [place] [address] (336) 641-8030 --Must be a resident of [place] °-- Must have NO insurance coverage whatsoever (no Medicaid/ Medicare, etc.) °-- The pt. MUST have a primary care doctor that directs their care regularly and follows them in the  community °  °[facility]  (866) 331-1348   °[employer]  (888) 892-1162   ° °Agencies that provide inexpensive medical care: °Organization         Address  Phone   Notes  °[facility]  (336) 832-8035   °[facility]    (336) 832-7272   °[facility] [address] °[place], [place] [address] (336) 832-4777   °[facility] [address], °[place] (336) 271-4999   °[facility]    (336) 373-0678   °[facility]    (336) 272-1050   °[facility] ° [address], [place] Phone:  (336) 832-4444, Fax:  (336) 832-4440 Hours of Operation:  9 am - 6 pm, M-F.  Also accepts Medicaid/Medicare and self-pay.  °[facility] ° [address], [place] Phone: (336) 832-3150, Fax: (336) 832-3151. Hours of Operation:  8:30 am - 5:30 pm, M-F.  Also accepts Medicaid and self-pay.  °[facility] [place] [address], [place] Phone: (336) 878-6027   °[facility] [address], [place],  (336)723-1848, Ext. 123 Mondays &   Thursdays: 7-9 AM.  First 15 patients are seen on a first come, first serve basis. °  ° °Medicaid-accepting [place] Providers: ° °Organization         Address  Phone   Notes  °[facility] [address], [place] (336) 641-2100 Also accepts self-pay patients.  °[facility] [address], [place] ° (336) 856-9996   °[facility] [address], [place] (336) 288-8857   °[facility] [address] [place] [address], [place] (336) 299-7000   °Veita Bland [address], [place]  ° (336) 373-1557 Only accepts [place] Access Medicaid patients after they have their name applied to their card.  ° °Self-Pay (no insurance) in [place]: ° °Organization         Address  Phone   Notes  °[facility] [address], [place] (336) 832-1970   °[facility] [address], [place] (336) 832-4400   °[facility] ° [address], [place] (336) 992-4800   °[facility]/Dr. Osei-Bonsu ° [address] [place] [address], [place] or [address], [place] (336) 841-8500 Phone number for both [place] and [place] locations is the same.  °[facility] [address], [place] (336) 299-0000   °[facility] [address] [place] [address], [place] or [address] (336) 852-7530 °(336) 878-2260   °[facility] [address], [place] (336) 350-1642, phone; (336) 294-5005, fax Sees patients 1st and 3rd Saturday of every month.  Must not qualify for public or private insurance (i.e. Medicaid, Medicare, [place] Health Choice, Veterans' Benefits) • Household income should be no more than 200% of the poverty level •The clinic cannot treat you if you are pregnant or think you are pregnant • Sexually transmitted diseases are not treated at the clinic.  ° ° °Dental Care: °Organization         Address  Phone  Notes  °[facility] [facility] [address], [place] (336) 641-6152 Accepts children up to age 21 who are enrolled in Medicaid or [place] Health Choice; pregnant women with a Medicaid card; and children who have applied for Medicaid or [place] Health Choice, but were declined, whose parents can pay a reduced fee at time of service.  °[facility] [place]  [address], [place] (336) 641-7733 Accepts children up to age 21 who are enrolled in Medicaid or [place] Health Choice; pregnant women with a Medicaid card; and children who have applied for Medicaid or  Health Choice, but were declined, whose parents can pay a reduced fee at time of service.  °[facility] ° [address],  (336) 641-4533 Patients are seen by appointment only. Walk-ins are not accepted. [facility] will see patients 18 years of age and older. °Monday - Tuesday (8am-5pm) °Most Wednesdays (8:30-5pm) °$30 per visit, cash only  °[facility] ° [address], [place] (336) 641-4533 Patients are seen by appointment only. Walk-ins are not accepted. [facility] will see patients 18 years of age and older. °One Wednesday Evening (Monthly: Volunteer Based).  $30 per visit, cash only  °[facility]  (919) 537-3737 for adults; Children under   age 4, call Graduate Pediatric Dentistry at (919) [phone]. Children aged 4-14, please call (919) [phone] to request a pediatric application. ° Dental services are provided in all areas of dental care including fillings, crowns and bridges, complete and partial dentures, implants, gum treatment, root canals, and extractions. Preventive care is also provided. Treatment is provided to both adults and children. °Patients are selected via a lottery and there is often a waiting list. °  °[facility] [address], °[place] ° (336) 763-8833 [email or web address] °  °[facility] [address], [place], [place] (336)723-1848, Ext. 123 Second and Fourth Thursday of each month, opens at 6:30 AM; Clinic ends at 9 AM.  Patients are seen on a first-come first-served basis, and a limited number are seen during each clinic.  ° °[facility] ° [address], [place], [place] (336) 723-7904   Eligibility Requirements °You must have lived in [place], [place], or [place] counties for at least the last three months. °  You cannot be eligible for state or federal sponsored healthcare insurance, including Veterans Administration, Medicaid, or Medicare. °  You generally cannot be eligible for healthcare insurance through your employer.  °  How to apply: °Eligibility screenings are held every  Tuesday and Wednesday afternoon from 1:00 pm until 4:00 pm. You do not need an appointment for the interview!  °[facility] [address], [place], [place] 336-631-2330   °[facility]  336-342-8273   °[facility]  336-703-3100   °[facility]  336-570-6415   ° °Behavioral Health Resources in the Community: °Intensive Outpatient Programs °Organization         Address  Phone  Notes  °[facility] [address], [place], [employer] 336-878-6098   °[facility] [address], [place], [place] 336-832-9800   °[facility] [address], [place], [place] ° 336-882-2125   °[facility] [address],  °[place], [place] 1-800-853-5163 or 336-641-4981   °Substance Abuse Resources °Organization         Address  Phone  Notes  °[facility]  336-882-2125   °[facility]  336-784-9470   °[facility]  336-285-9073   °[facility]  336-845-3988   °Residential & Outpatient Substance Abuse Program  1-800-659-3381   °Psychological Services °Organization         Address  Phone  Notes  °[facility]  336- 832-9600   °[facility]  336- 378-7881   °[facility] [address], [place] 1-800-853-5163 or 336-641-4981   ° °Mobile Crisis Teams °Organization         Address  Phone  Notes  °[facility], Mobile Crisis Care Unit  1-877-626-1772   °Assertive °[facility] ° [address] [place], [place] 336-834-9664   °Sharon DeEsch [address] °[place] 336-554-5454   ° °Self-Help/Support Groups °Organization         Address  Phone             Notes  °[facility] - variety of support groups  336- 373-1402 Call for more information  °Narcotics Anonymous (NA), [facility] [address], °[place]   2 meetings at this location   ° °Residential Treatment Programs °Organization         Address  Phone  Notes  °[facility] [address],    °[place]  1-866-801-8205   °[facility] ° [address], [place], [place] 704-293-8524   °[facility] [address], [place] 336-845-3988 Admissions: 8am-3pm M-F  °[facility] [address]    °[place], [place] 336-841-1104   °[facility] [address], [place], [place] 336-379-7146   °[facility] [address]  °[place], [place] 336-285-9073   °[facility] Intensive Outpatient [address], [place], [place] 336-852-3033   °[facility] [address]  °[place], [place] 1-877-615-2722 or 336-784-9470   °[facility] [address] [place], [place] 336-227-7417 Accepts Medicaid  °[facility] [address]  ° [place] 1-800-659-3381 Substance Abuse/Addiction Treatment  ° °[place] Behavioral Health Resources °Organization         Address  Phone  Notes  °[facility]  (888) 581-9988   °Julie Brannon, PhD [address] [place], [place]   (336) 349-5553 or (336) 951-0000   ° Behavioral   [address] °[place], [place] (336) 349-4454   °[facility] [address], [place], [place] (336) 342-8316 Insurance/Medicaid/sponsorship through [facility]  °[facility] [address]                                    [place], [place] (336) 342-8316 Therapy/tele-psych/case  °[facility] [address]  ° [place],  (336) 349-2233    °Dr. Arfeen  (336) 349-4544   °[facility] [place]  [employer] [place] [facility] 1) [address], [place] °2) [address], [place] °3)  [address], [place] (336) 349-3220 °(336) 342-7768 ° °(336) 342-8140   °[place] [facility] (336) 342-1394 or (336) 342-3537 (After  Hours)    ° ° ° °

## 2014-05-14 NOTE — ED Provider Notes (Signed)
7:34 AM Pt would like to go home. No HI or SI. No signs of withdrawal. Dc home. Outpatient resources given  Lyanne CoKevin M Raliegh Scobie, MD 05/14/14 [phone]

## 2014-05-14 NOTE — ED Provider Notes (Signed)
CSN: [id number]     Arrival date & time 05/14/14  0002 History   First MD Initiated Contact with Patient 05/14/14 0046     Chief Complaint  Patient presents with  . detox      (Consider location/radiation/quality/duration/timing/severity/associated sxs/prior Treatment) HPI Comments: Patient is an alcoholic, states he wants detox.  He drinks a case of beer a day.  His last alcoholic beverage was approximately 7:00 last night.  The last time he was sober was approximately a year ago when he went to detox.  He states toe before 7 days after discharge.  He denies any drug use.  Suicidal ideation. He has no family, lives at the homeless shelter  The history is provided by the patient.    Past Medical History  Diagnosis Date  . Knee joint pain   . Alcohol abuse    Past Surgical History  Procedure Laterality Date  . Cleft palate repair     Family History  Problem Relation Age of Onset  . Cancer Other    History  Substance Use Topics  . Smoking status: Current Every Day Smoker -- 1.00 packs/day    Types: Cigarettes  . Smokeless tobacco: Never Used  . Alcohol Use: Yes     Comment: heavy    Review of Systems  Respiratory: Negative for shortness of breath.   Cardiovascular: Negative for chest pain.  Gastrointestinal: Negative for abdominal pain.  Neurological: Negative for dizziness and headaches.  Psychiatric/Behavioral: Negative for suicidal ideas.  All other systems reviewed and are negative.     Allergies  Review of patient's allergies indicates no known allergies.  Home Medications   Prior to Admission medications   Medication Sig Start Date End Date Taking? Authorizing Provider  Aspirin-Acetaminophen-Caffeine (GOODYS EXTRA STRENGTH) [id number] MG PACK Take 1 Package by mouth every 6 (six) hours as needed (pain).    Historical Provider, MD  diazepam (VALIUM) 5 MG tablet Take 1 tablet (5 mg total) by mouth 2 (two) times daily. 02/02/14   Nelia Shiobert L Beaton, MD   BP  120/73 mmHg  Pulse 88  Temp(Src) 98.3 F (36.8 C) (Oral)  Resp 18  SpO2 99% Physical Exam  Constitutional: He appears well-developed and well-nourished.  Intoxicated  HENT:  Head: Normocephalic.  Eyes: Pupils are equal, round, and reactive to light.  Neck: Normal range of motion.  Cardiovascular: Normal rate.   Pulmonary/Chest: [place] normal.  Abdominal: Soft.  Musculoskeletal: Normal range of motion.  Neurological: He is alert.  Psychiatric: He has a normal mood and affect.  Nursing note and vitals reviewed.   ED Course  Procedures (including critical care time) Labs Review Labs Reviewed  COMPREHENSIVE METABOLIC PANEL - Abnormal; Notable for the following:    AST 103 (*)    ALT 115 (*)    Total Bilirubin <0.2 (*)    Anion gap 16 (*)    All other components within normal limits  ETHANOL - Abnormal; Notable for the following:    Alcohol, Ethyl (B) 256 (*)    All other components within normal limits  CBC WITH DIFFERENTIAL  URINE RAPID DRUG SCREEN (HOSP PERFORMED)    Imaging Review No results found.   EKG Interpretation None     patient has been on constant observation.  Throughout the night.  Sleeping a whole bed.  He wakes easily to gentle arousal.  He still states he wants detox .  There is no obvious sign of tremors .  Is been no vomiting or diarrhea .  He will be ambulated to gauge his steadiness and TTS will evaluate him  MDM   Final diagnoses:  None         Arman FilterGail K Yona Stansbury, NP 05/14/14 0559  Elwin MochaBlair Walden, MD 05/14/14 [phone]

## 2014-05-14 NOTE — ED Notes (Signed)
Pt is resting still but awakens easily to speak with this writer pt denies SI and HI

## 2014-05-14 NOTE — ED Notes (Signed)
Pt walked up to nurses station stating he just remembered it's thanksgiving and ha has to go to his mom's house. Pt is asking for bus pass. Will notify EDP.

## 2014-05-14 NOTE — ED Notes (Signed)
Pt states he is here to get detox from alcohol  Pt states last drank Wednesday night  Pt states he usually drinks as much as he can get

## 2014-06-30 ENCOUNTER — Encounter (HOSPITAL_COMMUNITY): Payer: Self-pay

## 2014-06-30 ENCOUNTER — Emergency Department (HOSPITAL_COMMUNITY)
Admission: EM | Admit: 2014-06-30 | Discharge: 2014-06-30 | Disposition: A | Discharge status: Home / Self Care | Payer: Self-pay | Attending: Emergency Medicine | Admitting: Emergency Medicine

## 2014-06-30 DIAGNOSIS — F1310 Sedative, hypnotic or anxiolytic abuse, uncomplicated: Secondary | ICD-10-CM | POA: Insufficient documentation

## 2014-06-30 DIAGNOSIS — F1210 Cannabis abuse, uncomplicated: Secondary | ICD-10-CM | POA: Insufficient documentation

## 2014-06-30 DIAGNOSIS — F1410 Cocaine abuse, uncomplicated: Secondary | ICD-10-CM | POA: Insufficient documentation

## 2014-06-30 DIAGNOSIS — Z720 Tobacco use: Secondary | ICD-10-CM | POA: Insufficient documentation

## 2014-06-30 DIAGNOSIS — F1910 Other psychoactive substance abuse, uncomplicated: Secondary | ICD-10-CM

## 2014-06-30 DIAGNOSIS — F1010 Alcohol abuse, uncomplicated: Secondary | ICD-10-CM | POA: Insufficient documentation

## 2014-06-30 DIAGNOSIS — Z79899 Other long term (current) drug therapy: Secondary | ICD-10-CM | POA: Insufficient documentation

## 2014-06-30 DIAGNOSIS — R440 Auditory hallucinations: Secondary | ICD-10-CM

## 2014-06-30 LAB — CBC
HCT: 41.4 % (ref 39.0–52.0)
HEMOGLOBIN: 14 g/dL (ref 13.0–17.0)
MCH: 30 pg (ref 26.0–34.0)
MCHC: 33.8 g/dL (ref 30.0–36.0)
MCV: 88.8 fL (ref 78.0–100.0)
Platelets: 173 10*3/uL (ref 150–400)
RBC: 4.66 MIL/uL (ref 4.22–5.81)
RDW: 13.6 % (ref 11.5–15.5)
WBC: 7.8 10*3/uL (ref 4.0–10.5)

## 2014-06-30 LAB — COMPREHENSIVE METABOLIC PANEL
ALT: 40 U/L (ref 0–53)
AST: 50 U/L — ABNORMAL HIGH (ref 0–37)
Albumin: 4.2 g/dL (ref 3.5–5.2)
Alkaline Phosphatase: 82 U/L (ref 39–117)
Anion gap: 5 (ref 5–15)
BILIRUBIN TOTAL: 0.4 mg/dL (ref 0.3–1.2)
BUN: 7 mg/dL (ref 6–23)
CHLORIDE: 109 meq/L (ref 96–112)
CO2: 26 mmol/L (ref 19–32)
CREATININE: 0.67 mg/dL (ref 0.50–1.35)
Calcium: 8.5 mg/dL (ref 8.4–10.5)
GFR calc non Af Amer: >90 mL/min (ref >90)
GLUCOSE: 100 mg/dL — AB (ref 70–99)
Potassium: 3.8 mmol/L (ref 3.5–5.1)
Sodium: 140 mmol/L (ref 135–145)
Total Protein: 6.9 g/dL (ref 6.0–8.3)

## 2014-06-30 LAB — SALICYLATE LEVEL: Salicylate Lvl: <4 mg/dL (ref 2.8–20.0)

## 2014-06-30 LAB — RAPID URINE DRUG SCREEN, HOSP PERFORMED
Amphetamines: NOT DETECTED
BENZODIAZEPINES: POSITIVE — AB
Barbiturates: NOT DETECTED
Cocaine: POSITIVE — AB
Opiates: NOT DETECTED
Tetrahydrocannabinol: POSITIVE — AB

## 2014-06-30 LAB — ACETAMINOPHEN LEVEL

## 2014-06-30 LAB — ETHANOL: Alcohol, Ethyl (B): 321 mg/dL — ABNORMAL HIGH (ref 0–9)

## 2014-06-30 MED ORDER — ZOLPIDEM TARTRATE 5 MG PO TABS: 5.0000 mg | ORAL_TABLET | Freq: Every evening | ORAL | Status: DC | PRN

## 2014-06-30 MED ORDER — LORAZEPAM 1 MG PO TABS: 0.0000 mg | ORAL_TABLET | Freq: Two times a day (BID) | ORAL | Status: DC

## 2014-06-30 MED ORDER — LORAZEPAM 1 MG PO TABS: 0.0000 mg | ORAL_TABLET | Freq: Four times a day (QID) | ORAL | Status: DC

## 2014-06-30 MED ORDER — ACETAMINOPHEN 325 MG PO TABS: 650.0000 mg | ORAL_TABLET | ORAL | Status: DC | PRN

## 2014-06-30 MED ORDER — ONDANSETRON HCL 4 MG PO TABS: 4.0000 mg | ORAL_TABLET | Freq: Three times a day (TID) | ORAL | Status: DC | PRN

## 2014-06-30 MED ORDER — NICOTINE 21 MG/24HR TD PT24: 21.0000 mg | MEDICATED_PATCH | Freq: Every day | TRANSDERMAL | Status: DC

## 2014-06-30 NOTE — Discharge Instructions (Signed)
Polysubstance Abuse When people abuse more than one drug or type of drug it is called polysubstance or polydrug abuse. For example, many smokers also drink alcohol. This is one form of polydrug abuse. Polydrug abuse also refers to the use of a drug to counteract an unpleasant effect produced by another drug. It may also be used to help with withdrawal from another drug. People who take stimulants may become agitated. Sometimes this agitation is countered with a tranquilizer. This helps protect against the unpleasant side effects. Polydrug abuse also refers to the use of different drugs at the same time.  Anytime drug use is interfering with normal living activities, it has become abuse. This includes problems with family and friends. Psychological dependence has developed when your mind tells you that the drug is needed. This is usually followed by physical dependence which has developed when continuing increases of drug are required to get the same feeling or "high". This is known as addiction or chemical dependency. A person's risk is much higher if there is a history of chemical dependency in the family. SIGNS OF CHEMICAL DEPENDENCY  You have been told by friends or family that drugs have become a problem.  You fight when using drugs.  You are having blackouts (not remembering what you do while using).  You feel sick from using drugs but continue using.  You lie about use or amounts of drugs (chemicals) used.  You need chemicals to get you going.  You are suffering in work performance or in school because of drug use.  You get sick from use of drugs but continue to use anyway.  You need drugs to relate to people or feel comfortable in social situations.  You use drugs to forget problems. "Yes" answered to any of the above signs of chemical dependency indicates there are problems. The longer the use of drugs continues, the greater the problems will become. If there is a family history of  drug or alcohol use, it is best not to experiment with these drugs. Continual use leads to tolerance. After tolerance develops more of the drug is needed to get the same feeling. This is followed by addiction. With addiction, drugs become the most important part of life. It becomes more important to take drugs than participate in the other usual activities of life. This includes relating to friends and family. Addiction is followed by dependency. Dependency is a condition where drugs are now needed not just to get high, but to feel normal. Addiction cannot be cured but it can be stopped. This often requires outside help and the care of professionals. Treatment centers are listed in the yellow pages under: Cocaine, Narcotics, and Alcoholics Anonymous. Most hospitals and clinics can refer you to a specialized care center. Talk to your caregiver if you need help. Document Released: 01/25/2005 Document Revised: 08/28/2011 Document Reviewed: 06/05/2005 [facility] Patient Information 2015 [place] [facility] [place], [place]. This information is not intended to replace advice given to you by your health care provider. Make sure you discuss any questions you have with your health care provider.  Alcohol Intoxication Alcohol intoxication occurs when the amount of alcohol that a person has consumed impairs his or her ability to mentally and physically function. Alcohol directly impairs the normal chemical activity of the brain. Drinking large amounts of alcohol can lead to changes in mental function and behavior, and it can cause many physical effects that can be harmful.  Alcohol intoxication can range in severity from mild to very severe. Various  factors can affect the level of intoxication that occurs, such as the person's age, gender, weight, frequency of alcohol consumption, and the presence of other medical conditions (such as diabetes, seizures, or heart conditions). Dangerous levels of alcohol intoxication may occur when people  drink large amounts of alcohol in a short period (binge drinking). Alcohol can also be especially dangerous when combined with certain prescription medicines or "recreational" drugs. SIGNS AND SYMPTOMS Some common signs and symptoms of mild alcohol intoxication include:  Loss of coordination.  Changes in mood and behavior.  Impaired judgment.  Slurred speech. As alcohol intoxication progresses to more severe levels, other signs and symptoms will appear. These may include:  Vomiting.  Confusion and impaired memory.  Slowed breathing.  Seizures.  Loss of consciousness. DIAGNOSIS  Your health care provider will take a medical history and perform a physical exam. You will be asked about the amount and type of alcohol you have consumed. Blood tests will be done to measure the concentration of alcohol in your blood. In many places, your blood alcohol level must be lower than 80 mg/dL (1.61%) to legally drive. However, many dangerous effects of alcohol can occur at much lower levels.  TREATMENT  People with alcohol intoxication often do not require treatment. Most of the effects of alcohol intoxication are temporary, and they go away as the alcohol naturally leaves the body. Your health care provider will monitor your condition until you are stable enough to go home. Fluids are sometimes given through an IV access tube to help prevent dehydration.  HOME CARE INSTRUCTIONS  Do not drive after drinking alcohol.  Stay hydrated. Drink enough water and fluids to keep your urine clear or pale yellow. Avoid caffeine.   Only take over-the-counter or prescription medicines as directed by your health care provider.  SEEK MEDICAL CARE IF:   You have persistent vomiting.   You do not feel better after a few days.  You have frequent alcohol intoxication. Your health care provider can help determine if you should see a substance use treatment counselor. SEEK IMMEDIATE MEDICAL CARE IF:   You  become shaky or tremble when you try to stop drinking.   You shake uncontrollably (seizure).   You throw up (vomit) blood. This may be bright red or may look like black coffee grounds.   You have blood in your stool. This may be bright red or may appear as a black, tarry, bad smelling stool.   You become lightheaded or faint.  MAKE SURE YOU:   Understand these instructions.  Will watch your condition.  Will get help right away if you are not doing well or get worse. Document Released: 03/15/2005 Document Revised: 02/05/2013 Document Reviewed: 11/08/2012 [facility] Patient Information 2015 [place], [place]. This information is not intended to replace advice given to you by your health care provider. Make sure you discuss any questions you have with your health care provider.  Emergency Department Resource Guide 1) Find a Doctor and Pay Out of Pocket Although you won't have to find out who is covered by your insurance plan, it is a good idea to ask around and get recommendations. You will then need to call the office and see if the doctor you have chosen will accept you as a new patient and what types of options they offer for patients who are self-pay. Some doctors offer discounts or will set up payment plans for their patients who do not have insurance, but you will need to ask so  you aren't surprised when you get to your appointment.  2) Contact Your Local Health Department Not all health departments have doctors that can see patients for sick visits, but many do, so it is worth a call to see if yours does. If you don't know where your local health department is, you can check in your phone book. The CDC also has a tool to help you locate your state's health department, and many state websites also have listings of all of their local health departments.  3) Find a Walk-in Clinic If your illness is not likely to be very severe or complicated, you may want to try a walk in clinic. These  are popping up all over the country in pharmacies, drugstores, and shopping centers. They're usually staffed by nurse practitioners or physician assistants that have been trained to treat common illnesses and complaints. They're usually fairly quick and inexpensive. However, if you have serious medical issues or chronic medical problems, these are probably not your best option.  No Primary Care Doctor: - Call Health Connect at  [phone] - they can help you locate a primary care doctor that  accepts your insurance, provides certain services, etc. - Physician Referral Service- [phone]  Chronic Pain Problems: Organization         Address  Phone   Notes  [facility]  [phone] Patients need to be referred by their primary care doctor.   Medication Assistance: Organization         Address  Phone   Notes  [facility] Medication [facility] [address] [place], [place] [address] [phone] --Must be a resident of [facility] -- Must have NO insurance coverage whatsoever (no Medicaid/ Medicare, etc.) -- The pt. MUST have a primary care doctor that directs their care regularly and follows them in the community   [facility]  [phone]   [employer]  [phone]    Agencies that provide inexpensive medical care: Organization         Address  Phone   Notes  [facility]  [phone]   [facility]    [phone]   [facility] [address] [place], [place] [address] [phone]   [facility] [address] [place] [place] [phone]   Planned Parenthood    [phone]   [facility]    [phone]   [facility]  [address], [place] Phone:  [phone], Fax:  [phone] Hours of Operation:  9 am - 6 pm, M-F.  Also accepts Medicaid/Medicare and self-pay.  [facility]   [address], [place] Phone: [phone], Fax: [phone]. Hours of Operation:  8:30 am - 5:30 pm, M-F.  Also accepts Medicaid and self-pay.  [facility] [place] [address], [place] Phone: [phone]   [facility] [address] Natasha Bence [place], [place] [phone], Ext. [phone] Mondays & Thursdays: 7-9 AM.  First 15 patients are seen on a first come, first serve basis.    Medicaid-accepting [facility] Providers:  Organization         Address  Phone   Notes  [facility] [address],  [phone] Also accepts self-pay patients.  [facility] [address] Laurell Josephs [place], [place]  [phone]  [facility] [address], [place] [phone]   [facility] [address], [place] [phone]   Renaye Rakers [address], [place]   [phone] Only accepts [place] Access [place] patients after they have their name applied to their card.   Self-Pay (no insurance) in [facility]:  Organization         Address  Phone   Notes  Sickle Cell Patients, [facility] Internal Medicine [address] [place], [place] [phone]   [facility] [address] [place], [place] [phone]   [facility] [place]  [address], [place] [phone]   [facility]/Dr. Osei-Bonsu  [address] [place] or [address], [place] [phone] Phone number for both [place] and [place] locations is the same.  [facility] [address], [place] [phone]   [facility] [address] [place] or [address] [phone]   [facility] [address], [place] [phone], phone; [phone], fax Sees patients 1st and 3rd  Saturday of every month.  Must not qualify for public or private insurance (i.e. Medicaid, Medicare, [place] Health Choice, Veterans' Benefits)  Household income should be no more than 200% of the poverty level The clinic cannot treat you if you are pregnant or think you are pregnant  Sexually transmitted diseases are not treated at the clinic.    Dental Care: Organization         Address  Phone  Notes  [facility] Department of [facility] [address] [place], [place] [phone] Accepts children up to age 61 who are enrolled in [place] or [place] Health Choice; pregnant women with a Medicaid card; and children who have applied for Medicaid or [place] Health Choice, but were declined, whose parents can pay a reduced fee at time of service.  [facility] of [facility]  [address], [place] [phone] Accepts children up to age 43 who are enrolled in [place] or [place] Health Choice; pregnant women with a Medicaid card; and children who have applied for Medicaid or  Health Choice, but were declined, whose parents can pay a reduced fee at time of service.  [facility]  [address] [place], [place] [phone] Patients are seen by appointment only. Walk-ins are not accepted. [facility] will see patients 39 years of age and older. Monday - Tuesday (8am-5pm) Most Wednesdays (8:30-5pm) $30 per visit, cash only  [facility] Adult [facility]  [address], [facility] [phone] Patients are seen by appointment only. Walk-ins are not accepted. [facility] will see patients 76 years of age and older. One Wednesday Evening (Monthly: Volunteer Based).  $30 per visit, cash only  [employer]  [phone] for adults; Children under age 52, call Graduate Pediatric Dentistry at [phone]. Children aged 30-14, please call [phone] to request a pediatric  application.  Dental services are provided in all areas of dental care including fillings, crowns and bridges, complete and partial dentures, implants, gum treatment, root canals, and extractions. Preventive care is also provided. Treatment is provided to both adults and children. Patients are selected via a lottery and there is often a waiting list.  [facility] [address], Ginette Otto  [phone] [email or web address]   [facility] [address] [place], [place] [phone], Ext. [phone] Second and Fourth Thursday of each month, opens at 6:30 AM; Clinic ends at 9 AM.  Patients are seen on a first-come first-served basis, and a limited number are seen during each clinic.   [facility]  [address] Ether Griffins [place], [place] [phone]   Eligibility Requirements You must have lived in [place], [place], or [place] counties for at least the last three months.   You cannot be eligible for state or federal sponsored [place], including [employer], [place], or [employer].   You generally cannot be eligible for healthcare insurance through your employer.    How to apply: Eligibility screenings are held every Tuesday and Wednesday afternoon from 1:00 pm until 4:00 pm. You do not need an appointment for the interview!  [facility] [address] [place], [place] [phone]   [facility] Health Department  [phone]   [facility] Health Department  [phone]   [facility]  [phone]    Behavioral Health Resources in the Community: Intensive Outpatient Programs Organization         Address  Phone  Notes  [facility] [address] [place], [place] [phone]   [facility] Outpatient [address] [place], [place] [phone]   [facility] [address] [place], [place]  [phone]   [facility]  [address],  [place], [place] [phone] or [phone]   Substance Abuse Resources Organization         Address  Phone  Notes  [facility]  [phone]   [facility]  [phone]   [facility]  [phone]   Floydene Flock  [phone]   Residential & Outpatient Substance Abuse Program  [phone]   Psychological Services Organization         Address  Phone  Notes  [facility] Behavioral Health  [phone]   [facility]  [phone]   [facility] Mental Health [address] [place] [phone] or [phone]    Mobile Crisis Teams Organization         Address  Phone  Notes  [facility], Mobile Crisis Care Unit  [phone]   [facility]  [address] [place], [place] [phone]   Doristine Locks [address] [place] [phone]    Self-Help/Support Groups Organization         Address  Phone             Notes  [facility] [place] - variety of support groups  336- I7437963 Call for more information  Narcotics Anonymous (NA), [facility] [address], [employer] [place]  2 meetings at this location   Statistician         Address  Phone  Notes  [facility] [address],    [place]  [phone]   [facility]  [address] [place] [address], [place], [place] [phone]   [facility] [address] [place], [place] [phone] Admissions: 8am-3pm M-F  [facility] [address]    [place], [place] [phone]   [facility] [address] [place], [place], [place] [phone]   [facility] [address],  [place], [place] [phone]   [facility] - Intensive Outpatient [address]ure[address], [place], [place] [phone]   [facility] ([facility] [address]    [place], [place] [phone] or [phone]   [facility] ([facility]) [address] [place], [place] [phone] Accepts Medicaid  [facility] [address]  [place] [phone] Substance Abuse/Addiction Treatment   [facility] Resources Organization         Address  Phone  Notes  [facility]  [phone]   Angie FavaJulie Brannon, PhD [address] [place], [place]   [phone] or [phone]) [phone]   [facility]   [address] [place], [place] [phone]) [phone]   [facility] [address], [place], [place] [phone]) [phone] Insurance/Medicaid/sponsorship through [facility]  [facility] [address]                                    [place], [place] [phone] Therapy/tele-psych/case  [facility] [address]   [place], [place] [phone]    Dr. Lolly MustacheArfeen  [phone](336) [phone]   [facility]  [employer] [facility] 1) [address], [place] 2) [address], [place] 3)  [address], [place] [phone]) [phone]  [phone]) [phone]   [facility] Child Abuse Hotline [phone]) [phone] or [phone]) [phone] (After Hours)

## 2014-06-30 NOTE — ED Provider Notes (Signed)
CSN: [id number]     Arrival date & time 06/30/14  0015 History   First MD Initiated Contact with Patient 06/30/14 0041     Chief Complaint  Patient presents with  . Alcohol Problem    (Consider location/radiation/quality/duration/timing/severity/associated sxs/prior Treatment) HPI Comments: Patient is a 39 year old male with history of alcohol abuse who presents to the emergency department requesting detox from alcohol. Patient states that he has been drinking daily since 12/16/2013. He states that he usually has at least a 24 pack of beer per day. He states he last drank 2 hours prior to arrival. He did detox from alcohol one year ago, but began drinking the day he completed detox. He states he has been experiencing some auditory hallucinations which worsen when drinking. He denies any command hallucinations, though does state that he has had past thoughts of jumping off a bridge. He denies any suicidal ideations at present. No homicidal thoughts or visual hallucinations. Patient denies illicit drug use. He has a questionable history of seizures from alcohol withdrawal; states he gets shaky and cannot outright deny a hx of seizure activity when withdrawing from ETOH.  Patient is a 39 y.o. male presenting with alcohol problem. The history is provided by the patient. No language interpreter was used.  Alcohol Problem    Past Medical History  Diagnosis Date  . Knee joint pain   . Alcohol abuse    Past Surgical History  Procedure Laterality Date  . Cleft palate repair     Family History  Problem Relation Age of Onset  . Cancer Other    History  Substance Use Topics  . Smoking status: Current Every Day Smoker -- 1.00 packs/day    Types: Cigarettes  . Smokeless tobacco: Never Used  . Alcohol Use: 14.4 oz/week    24 Cans of beer per week     Comment: drinks liquor occasionally    Review of Systems  Psychiatric/Behavioral: Positive for hallucinations and behavioral problems.  All  other systems reviewed and are negative.   Allergies  Review of patient's allergies indicates no known allergies.  Home Medications   Prior to Admission medications   Medication Sig Start Date End Date Taking? Authorizing Provider  diazepam (VALIUM) 5 MG tablet Take 1 tablet (5 mg total) by mouth 2 (two) times daily. Patient not taking: Reported on 05/14/2014 02/02/14   Nelia Shi, MD   BP 132/78 mmHg  Pulse 88  Temp(Src) 97.8 F (36.6 C) (Oral)  Resp 18  Ht  (1.727 m)  Wt 150 lb (68.04 kg)  BMI 22.81 kg/m2  SpO2 98%   Physical Exam  Constitutional: He is oriented to person, place, and time. He appears well-developed and well-nourished. No distress.  Nontoxic/nonseptic appearing  HENT:  Head: Normocephalic and atraumatic.  Eyes: Conjunctivae and EOM are normal. No scleral icterus.  Neck: Normal range of motion.  Pulmonary/Chest: Effort normal. No respiratory distress.  Respirations even and unlabored  Musculoskeletal: Normal range of motion.  Neurological: He is alert and oriented to person, place, and time. He exhibits normal muscle tone. Coordination normal.  GCS 15. Speech is goal oriented. Patient moves extremities without ataxia. He ambulates with steady gait.  Skin: Skin is warm and dry. No rash noted. He is not diaphoretic. No erythema. No pallor.  Psychiatric: He has a normal mood and affect. His behavior is normal. His speech is slurred (Mild). He is not actively hallucinating (no active hallucinations). He expresses no homicidal and no suicidal ideation.  He expresses no suicidal plans and no homicidal plans.  Nursing note and vitals reviewed.   ED Course  Procedures (including critical care time) Labs Review Labs Reviewed  ACETAMINOPHEN LEVEL - Abnormal; Notable for the following:    Acetaminophen (Tylenol), Serum <10.0 (*)    All other components within normal limits  COMPREHENSIVE METABOLIC PANEL - Abnormal; Notable for the following:    Glucose,  Bld 100 (*)    AST 50 (*)    All other components within normal limits  ETHANOL - Abnormal; Notable for the following:    Alcohol, Ethyl (B) 321 (*)    All other components within normal limits  URINE RAPID DRUG SCREEN (HOSP PERFORMED) - Abnormal; Notable for the following:    Cocaine POSITIVE (*)    Benzodiazepines POSITIVE (*)    Tetrahydrocannabinol POSITIVE (*)    All other components within normal limits  CBC  SALICYLATE LEVEL    Imaging Review No results found.   EKG Interpretation None      MDM   Final diagnoses:  Alcohol abuse  Polysubstance abuse  Auditory hallucinations    Patient presents requesting ETOH detox. Ethanol 321 on labs. UDS positive for cocaine, benzodiazepines, and THC. TTS states patient meets criteria for inpatient treatment; his placement is currently pending. Patient otherwise medically cleared. Disposition to be determined by oncoming ED provider.   Filed Vitals:   06/30/14 0033 06/30/14 0256 06/30/14 0259  BP: 122/78 132/78 132/78  Pulse: 98 88 88  Temp: 97.8 F (36.6 C)    TempSrc: Oral    Resp:  18   Height: 5\' 8"  (1.727 m)    Weight: 150 lb (68.04 kg)    SpO2: 99% 98%     Antony MaduraKelly Jeferson Boozer, PA-C 06/30/14 [id number]   0612 - Notified that patient no longer wanting detox. He is requesting discharge. No indication for IVC. Will discharge with resource guide.  Antony MaduraKelly Graison Leinberger, PA-C 06/30/14 [id number]  Elwin MochaBlair Walden, MD 06/30/14 [phone]

## 2014-06-30 NOTE — ED Notes (Addendum)
Patient reports he has been drinking daily since he got out of jail on 12/16/13. He states he is ready for rehab, reports when he goes 12-14 hours without ETOH he has withdrawal symptoms.  Last ETOH 2 hours PTA.

## 2014-06-30 NOTE — [facility] Counselor (Signed)
TTS counselor attempted to inform pt of disposition but he was asleep and unresponsive to counselor's attempts to wake him.   TTS counselor informed attending physician Antony Madura(Kelly Humes, PA-C) of disposition and need for placement.   Cyndie MullAnna Compton Brigance, [facility] T[facility] Specialist

## 2014-06-30 NOTE — [facility] Counselor (Signed)
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 TTS to seek placement.   Anthony MullAnna Likisha Beck, [facility]
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
## 2014-07-13 ENCOUNTER — Emergency Department (HOSPITAL_COMMUNITY)
Admission: EM | Admit: 2014-07-13 | Discharge: 2014-07-13 | Disposition: A | Discharge status: Home / Self Care | Payer: Self-pay | Attending: Emergency Medicine | Admitting: Emergency Medicine

## 2014-07-13 ENCOUNTER — Encounter (HOSPITAL_COMMUNITY): Payer: Self-pay | Admitting: Emergency Medicine

## 2014-07-13 DIAGNOSIS — Z720 Tobacco use: Secondary | ICD-10-CM | POA: Insufficient documentation

## 2014-07-13 DIAGNOSIS — F1010 Alcohol abuse, uncomplicated: Secondary | ICD-10-CM | POA: Insufficient documentation

## 2014-07-13 DIAGNOSIS — Z9119 Patient's noncompliance with other medical treatment and regimen: Secondary | ICD-10-CM | POA: Insufficient documentation

## 2014-07-13 DIAGNOSIS — R109 Unspecified abdominal pain: Secondary | ICD-10-CM | POA: Insufficient documentation

## 2014-07-13 NOTE — ED Provider Notes (Signed)
CSN: [id number]     Arrival date & time 07/13/14  1811 History   First MD Initiated Contact with Patient 07/13/14 2211     Chief Complaint  Patient presents with  . Addiction Problem     (Consider location/radiation/quality/duration/timing/severity/associated sxs/prior Treatment) Patient is a 39 y.o. male presenting with drug/alcohol assessment.  Drug / Alcohol Assessment Severity:  Severe Onset quality:  Gradual Duration: years. Timing:  Constant Progression:  Unchanged Chronicity:  Chronic Suspected agents:  Alcohol Associated symptoms: abdominal pain (left sided this morning, right sided this afternoon. "it's because I drink too much.")   Associated symptoms: no hallucinations   Associated symptoms comment:  No suicidal ideation, no homicidal ideation, no hallucinations   Past Medical History  Diagnosis Date  . Knee joint pain   . Alcohol abuse    Past Surgical History  Procedure Laterality Date  . Cleft palate repair     Family History  Problem Relation Age of Onset  . Cancer Other    History  Substance Use Topics  . Smoking status: Current Every Day Smoker -- 1.00 packs/day    Types: Cigarettes  . Smokeless tobacco: Never Used  . Alcohol Use: 14.4 oz/week    24 Cans of beer per week     Comment: drinks liquor occasionally    Review of Systems  Gastrointestinal: Positive for abdominal pain (left sided this morning, right sided this afternoon. "it's because I drink too much.").  Psychiatric/Behavioral: Negative for hallucinations.  All other systems reviewed and are negative.     Allergies  Review of patient's allergies indicates no known allergies.  Home Medications   Prior to Admission medications   Medication Sig Start Date End Date Taking? Authorizing Provider  diazepam (VALIUM) 5 MG tablet Take 1 tablet (5 mg total) by mouth 2 (two) times daily. Patient not taking: Reported on 05/14/2014 02/02/14   Nelia Shi, MD   BP 132/79 mmHg  Pulse 95   Temp(Src) 98.4 F (36.9 C) (Oral)  Resp 16  SpO2 99% Physical Exam  Constitutional: He is oriented to person, place, and time. He appears well-developed and well-nourished. No distress.  HENT:  Head: Normocephalic and atraumatic.  Eyes: Conjunctivae are normal. No scleral icterus.  Neck: Neck supple.  Cardiovascular: Normal rate and intact distal pulses.   Pulmonary/Chest: Effort normal. No stridor. No respiratory distress.  Abdominal: Normal appearance. He exhibits no distension. There is no tenderness. There is no rebound and no guarding.  Neurological: He is alert and oriented to person, place, and time.  Skin: Skin is warm and dry. No rash noted.  Psychiatric: He has a normal mood and affect. His behavior is normal.  Nursing note and vitals reviewed.   ED Course  Procedures (including critical care time) Labs Review Labs Reviewed - No data to display  Imaging Review No results found.   EKG Interpretation None      MDM   Final diagnoses:  Alcohol abuse    39 yo male with hx of alcoholism presenting requesting detox.  He reports drinking a fifth of vodka a day.  His last drink was beer, sometime this afternoon.  He has been in the waiting room for a few hours and now appears clinically sober.   I reviewed his last 3 ED visits.  All were for requesting detox.  Ultimately, at each of these visits, he elected to leave the ED prior to being placed in a detox program.  On further review of his chart,  his history of noncompliance with treatment programs is a clear pattern.  I have provided him outpatient resources.  He exhibits no signs of acute alcohol withdrawal or DTs.  He appears stable for discharge home.      Candyce ChurnJohn David Laveah Gloster III, MD 07/13/14 [phone]

## 2014-07-13 NOTE — ED Notes (Signed)
Pt here requesting detox from ETOH and crack; pt sts drinking fifth of liquor a day; pt sts last used crack yesterday

## 2014-07-13 NOTE — Discharge Instructions (Signed)
Alcohol Use Disorder °Alcohol use disorder is a mental disorder. It is not a one-time incident of heavy drinking. Alcohol use disorder is the excessive and uncontrollable use of alcohol over time that leads to problems with functioning in one or more areas of daily living. People with this disorder risk harming themselves and others when they drink to excess. Alcohol use disorder also can cause other mental disorders, such as mood and anxiety disorders, and serious physical problems. People with alcohol use disorder often misuse other drugs.  °Alcohol use disorder is common and widespread. Some people with this disorder drink alcohol to cope with or escape from negative life events. Others drink to relieve chronic pain or symptoms of mental illness. People with a family history of alcohol use disorder are at higher risk of losing control and using alcohol to excess.  °SYMPTOMS  °Signs and symptoms of alcohol use disorder may include the following:  °· Consumption of alcohol in larger amounts or over a longer period of time than intended. °· Multiple unsuccessful attempts to cut down or control alcohol use.   °· A great deal of time spent obtaining alcohol, using alcohol, or recovering from the effects of alcohol (hangover). °· A strong desire or urge to use alcohol (cravings).   °· Continued use of alcohol despite problems at work, school, or home because of alcohol use.   °· Continued use of alcohol despite problems in relationships because of alcohol use. °· Continued use of alcohol in situations when it is physically hazardous, such as driving a car. °· Continued use of alcohol despite awareness of a physical or psychological problem that is likely related to alcohol use. Physical problems related to alcohol use can involve the brain, heart, liver, stomach, and intestines. Psychological problems related to alcohol use include intoxication, depression, anxiety, psychosis, delirium, and dementia.   °· The need for  increased amounts of alcohol to achieve the same desired effect, or a decreased effect from the consumption of the same amount of alcohol (tolerance). °· Withdrawal symptoms upon reducing or stopping alcohol use, or alcohol use to reduce or avoid withdrawal symptoms. Withdrawal symptoms include: °¨ Racing heart. °¨ Hand tremor. °¨ Difficulty sleeping. °¨ Nausea. °¨ Vomiting. °¨ Hallucinations. °¨ Restlessness. °¨ Seizures. °DIAGNOSIS °Alcohol use disorder is diagnosed through an assessment by your health care provider. Your health care provider may start by asking three or four questions to screen for excessive or problematic alcohol use. To confirm a diagnosis of alcohol use disorder, at least two symptoms must be present within a 12-month period. The severity of alcohol use disorder depends on the number of symptoms: °· Mild--two or three. °· Moderate--four or five. °· Severe--six or more. °Your health care provider may perform a physical exam or use results from lab tests to see if you have physical problems resulting from alcohol use. Your health care provider may refer you to a mental health professional for evaluation. °TREATMENT  °Some people with alcohol use disorder are able to reduce their alcohol use to low-risk levels. Some people with alcohol use disorder need to quit drinking alcohol. When necessary, mental health professionals with specialized training in substance use treatment can help. Your health care provider can help you decide how severe your alcohol use disorder is and what type of treatment you need. The following forms of treatment are available:  °· Detoxification. Detoxification involves the use of prescription medicines to prevent alcohol withdrawal symptoms in the first week after quitting. This is important for people with a history of symptoms   of withdrawal and for heavy drinkers who are likely to have withdrawal symptoms. Alcohol withdrawal can be dangerous and, in severe cases, cause  death. Detoxification is usually provided in a hospital or in-patient substance use treatment facility. °· Counseling or talk therapy. Talk therapy is provided by substance use treatment counselors. It addresses the reasons people use alcohol and ways to keep them from drinking again. The goals of talk therapy are to help people with alcohol use disorder find healthy activities and ways to cope with life stress, to identify and avoid triggers for alcohol use, and to handle cravings, which can cause relapse. °· Medicines. Different medicines can help treat alcohol use disorder through the following actions: °¨ Decrease alcohol cravings. °¨ Decrease the positive reward response felt from alcohol use. °¨ Produce an uncomfortable physical reaction when alcohol is used (aversion therapy). °· Support groups. Support groups are run by people who have quit drinking. They provide emotional support, advice, and guidance. °These forms of treatment are often combined. Some people with alcohol use disorder benefit from intensive combination treatment provided by specialized substance use treatment centers. Both inpatient and outpatient treatment programs are available. °Document Released: 07/13/2004 Document Revised: 10/20/2013 Document Reviewed: 09/12/2012 °ExitCare® Patient Information ©2015 [employer]. This information is not intended to replace advice given to you by your health care provider. Make sure you discuss any questions you have with your health care provider. ° ° ° ° °Emergency Department Resource Guide °1) Find a Doctor and Pay Out of Pocket °Although you won't have to find out who is covered by your insurance plan, it is a good idea to ask around and get recommendations. You will then need to call the office and see if the doctor you have chosen will accept you as a new patient and what types of options they offer for patients who are self-pay. Some doctors offer discounts or will set up payment plans for  their patients who do not have insurance, but you will need to ask so you aren't surprised when you get to your appointment. ° °2) Contact Your Local Health Department °Not all health departments have doctors that can see patients for sick visits, but many do, so it is worth a call to see if yours does. If you don't know where your local health department is, you can check in your phone book. The CDC also has a tool to help you locate your state's health department, and many state websites also have listings of all of their local health departments. ° °3) Find a Walk-in Clinic °If your illness is not likely to be very severe or complicated, you may want to try a walk in clinic. These are popping up all over the country in pharmacies, drugstores, and shopping centers. They're usually staffed by nurse practitioners or physician assistants that have been trained to treat common illnesses and complaints. They're usually fairly quick and inexpensive. However, if you have serious medical issues or chronic medical problems, these are probably not your best option. ° °No Primary Care Doctor: °- Call [facility] at  832-8000 - they can help you locate a primary care doctor that  accepts your insurance, provides certain services, etc. °- Physician Referral Service- 1-800-533-3463 ° °Chronic Pain Problems: °Organization         Address  Phone   Notes  °[facility]  (336) 297-2271 Patients need to be referred by their primary care doctor.  ° °Medication Assistance: °Organization           Address  Phone   Notes  °[place] [facility] [address] °[place], [place] [address] (336) 641-8030 --Must be a resident of [place] °-- Must have NO insurance coverage whatsoever (no Medicaid/ Medicare, etc.) °-- The pt. MUST have a primary care doctor that directs their care regularly and follows them in the community °  °[facility]  (866) 331-1348   °[employer]  (888)  892-1162   ° °Agencies that provide inexpensive medical care: °Organization         Address  Phone   Notes  °[facility]  (336) 832-8035   °[facility]    (336) 832-7272   °[facility] [address] °[place], [place] [address] (336) 832-4777   °[facility] [address], °[place] (336) 271-4999   °[facility]    (336) 373-0678   °[facility]    (336) 272-1050   °[facility] ° [address], [place] Phone:  (336) 832-4444, Fax:  (336) 832-4440 Hours of Operation:  9 am - 6 pm, M-F.  Also accepts Medicaid/Medicare and self-pay.  °[facility] ° [address], [place] Phone: (336) 832-3150, Fax: (336) 832-3151. Hours of Operation:  8:30 am - 5:30 pm, M-F.  Also accepts Medicaid and self-pay.  °[facility] [place] [address], [place] Phone: (336) 878-6027   °[facility] [address], [place], [place] (336)723-1848, Ext. 123 Mondays & Thursdays: 7-9 AM.  First 15 patients are seen on a first come, first serve basis. °  ° °Medicaid-accepting [place] Providers: ° °Organization         Address  Phone   Notes  °[facility] [address], [place] (336) 641-2100 Also accepts self-pay patients.  °[facility] [address], [place] ° (336) 856-9996   °[facility] [address], [place] (336) 288-8857   °[facility] [address] [place] [address], [place] (336) 299-7000   °Veita Bland [address], [place]  ° (336) 373-1557 Only accepts [place] Access Medicaid patients after they have their name applied to their card.  ° °Self-Pay (no insurance) in [place]: ° °Organization         Address  Phone   Notes  °Sickle Cell Patients, [facility] [address], [place] (336)  832-1970   °[facility] [address], [place] (336) 832-4400   °[facility] [place] ° [address], [place] (336) 992-4800   °[facility]/Dr. Osei-Bonsu ° [address] [place] [address], [place] or [address], [place] (336) 841-8500 Phone number for both [place] and [place] locations is the same.  °[facility] [address], [place] (336) 299-0000   °[facility] [address] [place] [address], [place] or [address] (336) 852-7530 °(336) 878-2260   °[facility] [address], [place] (336) 350-1642, phone; (336) 294-5005, fax Sees patients 1st and 3rd Saturday of every month.  Must not qualify for public or private insurance (i.e. Medicaid, Medicare, [place] Health Choice, Veterans' Benefits) • Household income should be no more than 200% of the poverty level •The clinic cannot treat you if you are pregnant or think you   are pregnant • Sexually transmitted diseases are not treated at the clinic.  ° ° °Dental Care: °Organization         Address  Phone  Notes  °[facility] [address], [place] (336) 641-6152 Accepts children up to age 21 who are enrolled in Medicaid or [place] Health Choice; pregnant women with a Medicaid card; and children who have applied for Medicaid or [place] Health Choice, but were declined, whose parents can pay a reduced fee at time of service.  °[facility] [place]  [address], [place] (336) 641-7733 Accepts children up to age 21 who are enrolled in Medicaid or [place] Health Choice; pregnant women with a Medicaid card; and children who have applied for Medicaid or [place] Health Choice, but were declined, whose parents can pay a reduced fee at time of service.  °[facility] ° [address], [place] (336) 641-4533 Patients are  seen by appointment only. Walk-ins are not accepted. [facility] will see patients 18 years of age and older. °Monday - Tuesday (8am-5pm) °Most Wednesdays (8:30-5pm) °$30 per visit, cash only  °[facility] ° [address], [place] (336) 641-4533 Patients are seen by appointment only. Walk-ins are not accepted. [facility] will see patients 18 years of age and older. °One Wednesday Evening (Monthly: Volunteer Based).  $30 per visit, cash only  °[facility]  (919) 537-3737 for adults; Children under age 4, call Graduate Pediatric Dentistry at ([phone]) [phone]. Children aged 4-14, please call ([phone]) [phone] to request a pediatric application. ° Dental services are provided in all areas of dental care including fillings, crowns and bridges, complete and partial dentures, implants, gum treatment, root canals, and extractions. Preventive care is also provided. Treatment is provided to both adults and children. °Patients are selected via a lottery and there is often a waiting list. °  °[facility] [address], °[place] ° (336) 763-8833 [email or web address] °  °[facility] [address], [place], [place] (336)723-1848, Ext. 123 Second and Fourth Thursday of each month, opens at 6:30 AM; Clinic ends at 9 AM.  Patients are seen on a first-come first-served basis, and a limited number are seen during each clinic.  ° °[facility] ° [address], [place], [place] (336) 723-7904   Eligibility Requirements °You must have lived in [place], [place], or [place] counties for at least the last three months. °  You cannot be eligible for state or federal sponsored healthcare insurance, including Veterans Administration, Medicaid, or Medicare. °  You generally cannot be eligible for healthcare insurance through your employer.  °  How to apply: °Eligibility screenings are held every Tuesday and Wednesday afternoon from 1:00 pm until 4:00  pm. You do not need an appointment for the interview!  °[facility] [address], [place], [place] 336-631-2330   °[facility]  336-342-8273   °[facility]  336-703-3100   °[facility]  336-570-6415   ° °Behavioral Health Resources in the Community: °Intensive Outpatient Programs °Organization         Address  Phone  Notes  °[facility] [address], [place], [place] 336-878-6098   °[facility] [address], , [place] 336-832-9800   °[facility] [address], [place], [place] ° 336-882-2125   °[facility] [address],  °[place], [place] 1-800-853-5163 or 336-641-4981   °Substance Abuse Resources °Organization         Address  Phone  Notes  °[facility]  336-882-2125   °[facility]  336-784-9470   °[facility]  336-285-9073   °[facility]  336-845-3988   °Residential & Outpatient Substance Abuse Program  1-800-659-3381   °Psychological Services °Organization         Address  Phone  Notes  °[facility]  336- 832-9600   °[facility]  336- 378-7881   °[facility] [address], [place] 1-800-853-5163 or 336-641-4981   ° °Mobile Crisis Teams °Organization         Address  Phone  Notes  °[facility], Mobile Crisis Care Unit  1-877-626-1772   °Assertive °[facility] ° [address] [place], [place] 336-834-9664   °Sharon DeEsch [address] °[place] 336-554-5454   ° °Self-Help/Support Groups °Organization         Address  Phone             Notes  °[facility] - variety of support groups  336- 373-1402 Call for more information  °Narcotics Anonymous (NA), [facility] [address], °[place] [address]  2 meetings at this location  ° °Residential Treatment Programs °Organization          Address  Phone  Notes  °[facility] [address],    °[place]  1-866-801-8205   °[facility] ° [address], [place], [place] 704-293-8524   °[facility] [address], [place] 336-845-3988 Admissions: 8am-3pm M-F  °[facility] [address]    °[place], [place] 336-841-1104   °[facility] [address], [place], [place] 336-379-7146   °[facility] [address]  °[place], [place] 336-285-9073   °[facility] - Intensive Outpatient [address], , [place] 336-852-3033   °[facility] [address]  °[place], [place] 1-877-615-2722 or 336-784-9470   °[facility] [address] [place], [place] 336-227-7417 Accepts Medicaid  °[facility] [address]  ° [place] 1-800-659-3381 Substance Abuse/Addiction Treatment  ° °[place] Behavioral Health Resources °Organization         Address  Phone  Notes  °[facility]  (888) 581-9988   °Julie Brannon, PhD [address] [place], [place]   (336) 349-5553 or (336) 951-0000   °[facility]   [address] °[place], [place] (336) 349-4454   °[facility] [address], [place], [place] (336) 342-8316 Insurance/Medicaid/sponsorship through [facility]  °[facility] [address]                                    [place], [place] (336) 342-8316 Therapy/tele-psych/case  °[facility] [address]  ° [place], [place] (336) 349-2233    °Dr. Arfeen  (336) 349-4544   °[facility] [place]  [employer] [place] [facility] 1) [address], [place] °2) [address], [place] °3)  [address], [place] (336) 349-3220 °(336) 342-7768 ° °(336) 342-8140   °[place] [facility] (336)   342-1394 or (336) 342-3537 (After Hours)    ° ° ° °

## 2015-05-18 ENCOUNTER — Encounter (HOSPITAL_COMMUNITY): Payer: Self-pay | Admitting: Emergency Medicine

## 2015-05-18 ENCOUNTER — Inpatient Hospital Stay (HOSPITAL_COMMUNITY)
Admission: EM | Admit: 2015-05-18 | Discharge: 2015-05-20 | DRG: 917 | Disposition: A | Discharge status: Home / Self Care | Payer: Self-pay | Attending: Pulmonary Disease | Admitting: Pulmonary Disease

## 2015-05-18 ENCOUNTER — Emergency Department (HOSPITAL_COMMUNITY): Payer: Self-pay

## 2015-05-18 DIAGNOSIS — W19XXXA Unspecified fall, initial encounter: Secondary | ICD-10-CM

## 2015-05-18 DIAGNOSIS — F1010 Alcohol abuse, uncomplicated: Secondary | ICD-10-CM | POA: Diagnosis present

## 2015-05-18 DIAGNOSIS — F10129 Alcohol abuse with intoxication, unspecified: Secondary | ICD-10-CM

## 2015-05-18 DIAGNOSIS — R40243 Glasgow coma scale score 3-8, unspecified time: Secondary | ICD-10-CM

## 2015-05-18 DIAGNOSIS — R402212 Coma scale, best verbal response, none, at arrival to emergency department: Secondary | ICD-10-CM | POA: Diagnosis present

## 2015-05-18 DIAGNOSIS — F1399 Sedative, hypnotic or anxiolytic use, unspecified with unspecified sedative, hypnotic or anxiolytic-induced disorder: Secondary | ICD-10-CM | POA: Diagnosis present

## 2015-05-18 DIAGNOSIS — T510X1A Toxic effect of ethanol, accidental (unintentional), initial encounter: Secondary | ICD-10-CM | POA: Diagnosis present

## 2015-05-18 DIAGNOSIS — T50901A Poisoning by unspecified drugs, medicaments and biological substances, accidental (unintentional), initial encounter: Secondary | ICD-10-CM | POA: Insufficient documentation

## 2015-05-18 DIAGNOSIS — E876 Hypokalemia: Secondary | ICD-10-CM | POA: Diagnosis present

## 2015-05-18 DIAGNOSIS — J9811 Atelectasis: Secondary | ICD-10-CM | POA: Diagnosis present

## 2015-05-18 DIAGNOSIS — R402332 Coma scale, best motor response, abnormal, at arrival to emergency department: Secondary | ICD-10-CM | POA: Diagnosis present

## 2015-05-18 DIAGNOSIS — Z978 Presence of other specified devices: Secondary | ICD-10-CM

## 2015-05-18 DIAGNOSIS — R402112 Coma scale, eyes open, never, at arrival to emergency department: Secondary | ICD-10-CM | POA: Diagnosis present

## 2015-05-18 DIAGNOSIS — F1410 Cocaine abuse, uncomplicated: Secondary | ICD-10-CM | POA: Diagnosis present

## 2015-05-18 DIAGNOSIS — T424X1A Poisoning by benzodiazepines, accidental (unintentional), initial encounter: Principal | ICD-10-CM | POA: Diagnosis present

## 2015-05-18 DIAGNOSIS — F1290 Cannabis use, unspecified, uncomplicated: Secondary | ICD-10-CM | POA: Diagnosis present

## 2015-05-18 DIAGNOSIS — F10239 Alcohol dependence with withdrawal, unspecified: Secondary | ICD-10-CM | POA: Diagnosis not present

## 2015-05-18 DIAGNOSIS — W1830XA Fall on same level, unspecified, initial encounter: Secondary | ICD-10-CM | POA: Diagnosis present

## 2015-05-18 DIAGNOSIS — J9601 Acute respiratory failure with hypoxia: Secondary | ICD-10-CM | POA: Diagnosis present

## 2015-05-18 DIAGNOSIS — F17210 Nicotine dependence, cigarettes, uncomplicated: Secondary | ICD-10-CM | POA: Diagnosis present

## 2015-05-18 DIAGNOSIS — R4020 Unspecified coma: Secondary | ICD-10-CM

## 2015-05-18 DIAGNOSIS — Z87730 Personal history of (corrected) cleft lip and palate: Secondary | ICD-10-CM

## 2015-05-18 DIAGNOSIS — F10929 Alcohol use, unspecified with intoxication, unspecified: Secondary | ICD-10-CM

## 2015-05-18 DIAGNOSIS — Y907 Blood alcohol level of 200-239 mg/100 ml: Secondary | ICD-10-CM | POA: Diagnosis present

## 2015-05-18 DIAGNOSIS — F10229 Alcohol dependence with intoxication, unspecified: Secondary | ICD-10-CM | POA: Diagnosis present

## 2015-05-18 DIAGNOSIS — G92 Toxic encephalopathy: Secondary | ICD-10-CM | POA: Diagnosis present

## 2015-05-18 DIAGNOSIS — Y92414 Local residential or business street as the place of occurrence of the external cause: Secondary | ICD-10-CM

## 2015-05-18 DIAGNOSIS — J9600 Acute respiratory failure, unspecified whether with hypoxia or hypercapnia: Secondary | ICD-10-CM

## 2015-05-18 DIAGNOSIS — R030 Elevated blood-pressure reading, without diagnosis of hypertension: Secondary | ICD-10-CM | POA: Diagnosis present

## 2015-05-18 DIAGNOSIS — S0081XA Abrasion of other part of head, initial encounter: Secondary | ICD-10-CM | POA: Diagnosis present

## 2015-05-18 LAB — BLOOD GAS, ARTERIAL
ACID-BASE DEFICIT: 3.7 mmol/L — AB (ref 0.0–2.0)
BICARBONATE: 21.1 meq/L (ref 20.0–24.0)
Drawn by: 449561
FIO2: 1
LHR: 14 {breaths}/min
O2 SAT: 99.6 %
PATIENT TEMPERATURE: 98.6
PCO2 ART: 39.5 mmHg (ref 35.0–45.0)
PEEP/CPAP: 5 cmH2O
PH ART: 7.348 — AB (ref 7.350–7.450)
TCO2: 18.8 mmol/L (ref 0–100)
VT: 540 mL
pO2, Arterial: 467 mmHg — ABNORMAL HIGH (ref 80.0–100.0)

## 2015-05-18 LAB — CBC
HCT: 42.2 % (ref 39.0–52.0)
Hemoglobin: 14.2 g/dL (ref 13.0–17.0)
MCH: 30 pg (ref 26.0–34.0)
MCHC: 33.6 g/dL (ref 30.0–36.0)
MCV: 89.2 fL (ref 78.0–100.0)
PLATELETS: 254 10*3/uL (ref 150–400)
RBC: 4.73 MIL/uL (ref 4.22–5.81)
RDW: 13.2 % (ref 11.5–15.5)
WBC: 7.5 10*3/uL (ref 4.0–10.5)

## 2015-05-18 LAB — COMPREHENSIVE METABOLIC PANEL
ALBUMIN: 4.4 g/dL (ref 3.5–5.0)
ALK PHOS: 68 U/L (ref 38–126)
ALT: 61 U/L (ref 17–63)
AST: 66 U/L — ABNORMAL HIGH (ref 15–41)
Anion gap: 9 (ref 5–15)
BUN: 10 mg/dL (ref 6–20)
CALCIUM: 9.1 mg/dL (ref 8.9–10.3)
CO2: 26 mmol/L (ref 22–32)
CREATININE: 0.92 mg/dL (ref 0.61–1.24)
Chloride: 108 mmol/L (ref 101–111)
GFR calc Af Amer: >60 mL/min (ref >60)
GFR calc non Af Amer: >60 mL/min (ref >60)
GLUCOSE: 92 mg/dL (ref 65–99)
Potassium: 3.8 mmol/L (ref 3.5–5.1)
Sodium: 143 mmol/L (ref 135–145)
Total Bilirubin: 0.4 mg/dL (ref 0.3–1.2)
Total Protein: 7.5 g/dL (ref 6.5–8.1)

## 2015-05-18 LAB — ACETAMINOPHEN LEVEL: Acetaminophen (Tylenol), Serum: <10 ug/mL — ABNORMAL LOW (ref 10–30)

## 2015-05-18 LAB — SALICYLATE LEVEL

## 2015-05-18 LAB — CBG MONITORING, ED: Glucose-Capillary: 85 mg/dL (ref 65–99)

## 2015-05-18 LAB — ETHANOL: Alcohol, Ethyl (B): 226 mg/dL — ABNORMAL HIGH (ref <5)

## 2015-05-18 LAB — TROPONIN I: Troponin I: <0.03 ng/mL (ref <0.031)

## 2015-05-18 MED ORDER — MIDAZOLAM HCL 2 MG/2ML IJ SOLN
2.0000 mg | INTRAMUSCULAR | Status: DC | PRN
  Administered 2015-05-19 (×2): 2 mg via INTRAVENOUS
  Filled 2015-05-18 (×3): qty 2

## 2015-05-18 MED ORDER — PROPOFOL 1000 MG/100ML IV EMUL
0.0000 ug/kg/min | INTRAVENOUS | Status: DC
  Administered 2015-05-19: 50 ug/kg/min via INTRAVENOUS

## 2015-05-18 MED ORDER — MIDAZOLAM HCL 2 MG/2ML IJ SOLN
2.0000 mg | INTRAMUSCULAR | Status: DC | PRN
  Administered 2015-05-19: 2 mg via INTRAVENOUS

## 2015-05-18 MED ORDER — PROPOFOL 1000 MG/100ML IV EMUL
5.0000 ug/kg/min | Freq: Once | INTRAVENOUS | Status: DC
  Administered 2015-05-18: 10 ug/kg/min via INTRAVENOUS

## 2015-05-18 MED ORDER — HEPARIN SODIUM (PORCINE) 5000 UNIT/ML IJ SOLN
5000.0000 [IU] | Freq: Three times a day (TID) | INTRAMUSCULAR | Status: DC
  Administered 2015-05-19 – 2015-05-20 (×4): 5000 [IU] via SUBCUTANEOUS
  Filled 2015-05-18 (×4): qty 1

## 2015-05-18 MED ORDER — ADULT MULTIVITAMIN LIQUID CH
5.0000 mL | Freq: Every day | ORAL | Status: DC
  Administered 2015-05-19 – 2015-05-20 (×2): 5 mL via ORAL
  Filled 2015-05-18 (×4): qty 5

## 2015-05-18 MED ORDER — HYDRALAZINE HCL 20 MG/ML IJ SOLN
10.0000 mg | INTRAMUSCULAR | Status: DC | PRN
  Administered 2015-05-19: 20 mg via INTRAVENOUS
  Filled 2015-05-18: qty 1

## 2015-05-18 MED ORDER — FOLIC ACID 5 MG/ML IJ SOLN
1.0000 mg | Freq: Every day | INTRAMUSCULAR | Status: DC
  Administered 2015-05-19 – 2015-05-20 (×2): 1 mg via INTRAVENOUS
  Filled 2015-05-18 (×3): qty 0.2

## 2015-05-18 MED ORDER — ONDANSETRON HCL 4 MG/2ML IJ SOLN: 4.0000 mg | Freq: Four times a day (QID) | INTRAMUSCULAR | Status: DC | PRN

## 2015-05-18 MED ORDER — NALOXONE HCL 2 MG/2ML IJ SOSY
PREFILLED_SYRINGE | INTRAMUSCULAR | Status: AC
  Administered 2015-05-18: 2 mg
  Filled 2015-05-18: qty 2

## 2015-05-18 MED ORDER — PROPOFOL 1000 MG/100ML IV EMUL
INTRAVENOUS | Status: AC
Start: 2015-05-18 — End: 2015-05-19
  Filled 2015-05-18: qty 100

## 2015-05-18 MED ORDER — RANITIDINE HCL 150 MG/10ML PO SYRP
150.0000 mg | ORAL_SOLUTION | Freq: Every day | ORAL | Status: DC
  Administered 2015-05-19 (×2): 150 mg
  Filled 2015-05-18 (×3): qty 10

## 2015-05-18 MED ORDER — ONDANSETRON HCL 4 MG/2ML IJ SOLN
INTRAMUSCULAR | Status: AC
  Administered 2015-05-18: 4 mg
  Filled 2015-05-18: qty 2

## 2015-05-18 MED ORDER — SODIUM CHLORIDE 0.9 % IV SOLN: 250.0000 mL | INTRAVENOUS | Status: DC | PRN

## 2015-05-18 MED ORDER — DEXTROSE-NACL 5-0.45 % IV SOLN
INTRAVENOUS | Status: DC
  Administered 2015-05-19 (×3): via INTRAVENOUS

## 2015-05-18 MED ORDER — THIAMINE HCL 100 MG/ML IJ SOLN
100.0000 mg | Freq: Every day | INTRAMUSCULAR | Status: DC
  Administered 2015-05-19 – 2015-05-20 (×2): 100 mg via INTRAVENOUS
  Filled 2015-05-18 (×2): qty 2

## 2015-05-18 MED ORDER — FENTANYL CITRATE (PF) 100 MCG/2ML IJ SOLN
100.0000 ug | INTRAMUSCULAR | Status: DC | PRN
  Administered 2015-05-19: 100 ug via INTRAVENOUS

## 2015-05-18 MED ORDER — ROCURONIUM BROMIDE 50 MG/5ML IV SOLN
1.0000 mg/kg | Freq: Once | INTRAVENOUS | Status: DC
  Administered 2015-05-18: 10 mg via INTRAVENOUS

## 2015-05-18 MED ORDER — FENTANYL CITRATE (PF) 100 MCG/2ML IJ SOLN
100.0000 ug | INTRAMUSCULAR | Status: DC | PRN
  Filled 2015-05-18: qty 2

## 2015-05-18 NOTE — ED Notes (Signed)
Pt was seen by EMS earlier today and had been drinking (2-3 pints of liquor and 4mg  Xanax) but did not go with EMS. EMS was called back out after patient tried to lie down in the floor in the [employer]. Pt became more unresponsive in transport w/ pinpoint pupils RR 16. EMS states pt is normally hard to understand when speaking.

## 2015-05-18 NOTE — ED Notes (Signed)
Bed: RESA Expected date:  Expected time:  Means of arrival:  Comments: EMS intoxicated male

## 2015-05-18 NOTE — ED Provider Notes (Signed)
CSN: [id number]     Arrival date & time 05/18/15  2155 History   First MD Initiated Contact with Patient 05/18/15 2222     Chief Complaint  Patient presents with  . Unresponsive    . Alcohol Problem     (Consider location/radiation/quality/duration/timing/severity/associated sxs/prior Treatment) HPI Comments: The patient is a [age] year old male, he is a known daily alcoholic, his wife who accompanies him by paramedic transport states that he was drinking heavily all day, at the liquor store he ran into somebody who gave him 32 mg Xanax, after taking 2 of them he came out of the liquor store and promptly fell down hitting his head on the ground, he has been unresponsive since that time. There was no seizure activity, there was vomiting prehospital. The patient is totally unresponsive, level V caveat applies  Patient is a 39 y.o. male presenting with alcohol problem. The history is provided by the spouse.  Alcohol Problem    Past Medical History  Diagnosis Date  . Knee joint pain   . Alcohol abuse    Past Surgical History  Procedure Laterality Date  . Cleft palate repair     Family History  Problem Relation Age of Onset  . Cancer Other    Social History  Substance Use Topics  . Smoking status: Current Every Day Smoker -- 1.00 packs/day    Types: Cigarettes  . Smokeless tobacco: Never Used  . Alcohol Use: 14.4 oz/week    24 Cans of beer per week     Comment: drinks liquor occasionally    Review of Systems  Unable to perform ROS: Patient unresponsive      Allergies  Review of patient's allergies indicates no known allergies.  Home Medications   Prior to Admission medications   Medication Sig Start Date End Date Taking? Authorizing Provider  alprazolam Prudy Feeler(XANAX) 2 MG tablet Take 4 mg by mouth once.   Yes Historical Provider, MD   BP 116/59 mmHg  Pulse 94  Temp(Src) 99.5 F (37.5 C) (Oral)  Resp 26  Ht 5\' 8"  (1.727 m)  Wt 125 lb 10.6 oz (57 kg)  BMI 19.11 kg/m2   SpO2 90% Physical Exam  Constitutional: He appears distressed.  HENT:  Head: Normocephalic and atraumatic.  Mouth/Throat: Oropharynx is clear and moist. No oropharyngeal exudate.  Eyes: Conjunctivae and EOM are normal. Pupils are equal, round, and reactive to light. Right eye exhibits no discharge. Left eye exhibits no discharge. No scleral icterus.  Neck: Normal range of motion. Neck supple. No JVD present. No thyromegaly present.  Cardiovascular: Regular rhythm, normal heart sounds and intact distal pulses.  Exam reveals no gallop and no friction rub.   No murmur heard. tachycardia  Pulmonary/Chest: Effort normal and breath sounds normal. No respiratory distress. He has no wheezes. He has no rales.  Abdominal: Soft. Bowel sounds are normal. He exhibits no distension and no mass. There is no tenderness.  Musculoskeletal: Normal range of motion. He exhibits no edema or tenderness.  Lymphadenopathy:    He has no cervical adenopathy.  Neurological: GCS eye subscore is 1. GCS verbal subscore is 1. GCS motor subscore is 4.  The patient is obtunded, he responds to no verbal stimuli, he does not open his eyes, he does not speak or even groan, with a deep painful stimuli he will move his arms a small amount. He has minimal gag occasionally  Skin: Skin is warm and dry. No rash noted. No erythema.  Psychiatric: He has a  normal mood and affect. His behavior is normal.  Nursing note and vitals reviewed.   ED Course  Procedures (including critical care time) Labs Review Labs Reviewed  COMPREHENSIVE METABOLIC PANEL - Abnormal; Notable for the following:    AST 66 (*)    All other components within normal limits  ETHANOL - Abnormal; Notable for the following:    Alcohol, Ethyl (B) 226 (*)    All other components within normal limits  URINE RAPID DRUG SCREEN, HOSP PERFORMED - Abnormal; Notable for the following:    Cocaine POSITIVE (*)    Benzodiazepines POSITIVE (*)    All other components  within normal limits  BLOOD GAS, ARTERIAL - Abnormal; Notable for the following:    pH, Arterial 7.348 (*)    pO2, Arterial 467 (*)    Acid-base deficit 3.7 (*)    All other components within normal limits  ACETAMINOPHEN LEVEL - Abnormal; Notable for the following:    Acetaminophen (Tylenol), Serum <10 (*)    All other components within normal limits  ACETAMINOPHEN LEVEL - Abnormal; Notable for the following:    Acetaminophen (Tylenol), Serum <10 (*)    All other components within normal limits  COMPREHENSIVE METABOLIC PANEL - Abnormal; Notable for the following:    Calcium 8.8 (*)    AST 63 (*)    All other components within normal limits  LACTIC ACID, PLASMA - Abnormal; Notable for the following:    Lactic Acid, Venous 2.5 (*)    All other components within normal limits  CBC - Abnormal; Notable for the following:    WBC 11.9 (*)    All other components within normal limits  BASIC METABOLIC PANEL - Abnormal; Notable for the following:    Potassium 3.4 (*)    All other components within normal limits  BLOOD GAS, ARTERIAL - Abnormal; Notable for the following:    pCO2 arterial 33.4 (*)    All other components within normal limits  MRSA PCR SCREENING  CBC  URINALYSIS, ROUTINE W REFLEX MICROSCOPIC (NOT AT [facility])  TROPONIN I  SALICYLATE LEVEL  TRIGLYCERIDES  SALICYLATE LEVEL  MAGNESIUM  PHOSPHORUS  AMYLASE  LIPASE, BLOOD  TROPONIN I  TROPONIN I  CBC WITH DIFFERENTIAL/PLATELET  PROTIME-INR  MAGNESIUM  PHOSPHORUS  TROPONIN I  CBC  CBG MONITORING, ED    Imaging Review Ct Head Wo Contrast  05/19/2015  CLINICAL DATA:  Found unresponsive.  Abrasion on forehead. EXAM: CT HEAD WITHOUT CONTRAST CT CERVICAL SPINE WITHOUT CONTRAST TECHNIQUE: Multidetector CT imaging of the head and cervical spine was performed following the standard protocol without intravenous contrast. Multiplanar CT image reconstructions of the cervical spine were also generated. COMPARISON:  None. FINDINGS:  CT HEAD FINDINGS There is no intracranial hemorrhage, mass or evidence of acute infarction. There is no extra-axial fluid collection. There is mild generalized atrophy. No bone abnormality. Visible paranasal sinuses are clear. Chronic appearing mastoid sclerosis, left greater than right. CT CERVICAL SPINE FINDINGS The vertebral column, pedicles and facet articulations are intact. There is no evidence of acute fracture. No acute soft tissue abnormalities are evident. No significant arthritic changes are evident. IMPRESSION: 1. Negative for acute intracranial traumatic injury. There is mild generalized atrophy. 2. Negative for acute cervical spine fracture. Electronically Signed   By: Ellery Plunk M.D.   On: 05/19/2015 01:07   Ct Cervical Spine Wo Contrast  05/19/2015  CLINICAL DATA:  Found unresponsive.  Abrasion on forehead. EXAM: CT HEAD WITHOUT CONTRAST CT CERVICAL SPINE WITHOUT CONTRAST  TECHNIQUE: Multidetector CT imaging of the head and cervical spine was performed following the standard protocol without intravenous contrast. Multiplanar CT image reconstructions of the cervical spine were also generated. COMPARISON:  None. FINDINGS: CT HEAD FINDINGS There is no intracranial hemorrhage, mass or evidence of acute infarction. There is no extra-axial fluid collection. There is mild generalized atrophy. No bone abnormality. Visible paranasal sinuses are clear. Chronic appearing mastoid sclerosis, left greater than right. CT CERVICAL SPINE FINDINGS The vertebral column, pedicles and facet articulations are intact. There is no evidence of acute fracture. No acute soft tissue abnormalities are evident. No significant arthritic changes are evident. IMPRESSION: 1. Negative for acute intracranial traumatic injury. There is mild generalized atrophy. 2. Negative for acute cervical spine fracture. Electronically Signed   By: Ellery Plunk M.D.   On: 05/19/2015 01:07   Dg Chest Port 1 View  05/19/2015   CLINICAL DATA:  Intubation. EXAM: PORTABLE CHEST 1 VIEW COMPARISON:  05/18/2015. FINDINGS: Endotracheal tube, and NG tube in stable position. Right base subsegmental atelectasis and or infiltrate. No pleural effusion pneumothorax. Heart size normal. No acute bony abnormality . IMPRESSION: 1. Lines and tubes in stable position. 2. Base subsegment atelectasis and or infiltrate . Electronically Signed   By: Maisie Fus  Register   On: 05/19/2015 07:10   Dg Chest Portable 1 View  05/18/2015  CLINICAL DATA:  39 year old male status post intubation an enteric tube placement. EXAM: PORTABLE CHEST 1 VIEW COMPARISON:  None. FINDINGS: An endotracheal tube is noted with tip approximately 3 cm above the carina. An enteric tube is partially visualized coursing into the left upper abdomen. The tip of the enteric tube is beyond the image cut off. There is linear subsegmental atelectatic changes of the right lung base. There is no focal consolidation, pleural effusion, or pneumothorax. The cardiac silhouette is within normal limits. The visualized osseous structures are grossly unremarkable. IMPRESSION: No acute cardiopulmonary process. Support tubes as described. Electronically Signed   By: Elgie Collard M.D.   On: 05/18/2015 23:51   I have personally reviewed and evaluated these images and lab results as part of my medical decision-making.   EKG Interpretation   Date/Time:  Tuesday May 18 2015 21:59:21 EST Ventricular Rate:  88 PR Interval:  147 QRS Duration: 99 QT Interval:  391 QTC Calculation: 473 R Axis:   83 Text Interpretation:  Sinus rhythm Biatrial enlargement RSR' in V1 or V2,  probably normal variant since last tracing no significant change Confirmed  by Faithanne Verret  MD, Naziyah Tieszen ([id number]) on 05/18/2015 10:39:43 PM      MDM   Final diagnoses:  Overdose, accidental or unintentional, initial encounter  Acute respiratory failure, unspecified whether with hypoxia or hypercapnia (HCC)    Due to the  patient's severe decompensated altered mental status likely related to a combination of both the alcohol intoxication, medications and the head injury he was intubated for airway protection. At this time the patient will go to CT scan imaging, chest x-ray to evaluate for tube position, I have RD discussed his care with the intensive care unit physician who will accept him to the intensive care unit. The patient has received no sedation, he will receive minimal propofol for sedation until he needs more. He is critically ill with severe airway compromise  The patient required ongoing sedation in the emergency department, his chest x-ray revealed endotracheal tube and NG tube in the correct position, he has mild tachycardia, severe hypertension requiring sedation and antihypertensive medications, I discuss his  care with the intensivist who has seen the patient in the emergency department and will admit to the intensive care unit. Last overall unremarkable except for alcohol level and his drug screen which was positive for benzodiazepines and cocaine, this may be part of why he was agitated and hypertensive, benzodiazepines given in the emergency department for his agitation and hypertension as well this assisted with sedation in addition to propofol which required titration for effect.  INTUBATION Performed by: Vida Roller  Required items: required blood products, implants, devices, and special equipment available Patient identity confirmed: provided demographic data and hospital-assigned identification number Time out: Immediately prior to procedure a "time out" was called to verify the correct patient, procedure, equipment, support staff and site/side marked as required.  Indications: respiratory failure  Intubation method: Direct Laryngoscopy   Preoxygenation: BVM high flow NRB  Sedated prior to intubation with meds in the outpatient setting and ETOH Paralytic: 100mg  Rocuronium   Tube Size:  8-0 cuffed  Post-procedure assessment: chest rise and ETCO2 monitor Breath sounds: equal and absent over the epigastrium Tube secured with: ETT holder Chest x-ray interpreted by radiologist and me.  Chest x-ray findings: endotracheal tube in appropriate position  Patient tolerated the procedure well with no immediate complications.     CRITICAL CARE Performed by: Vida Roller Total critical care time: 35 minutes Critical care time was exclusive of separately billable procedures and treating other patients. Critical care was necessary to treat or prevent imminent or life-threatening deterioration. Critical care was time spent personally by me on the following activities: development of treatment plan with patient and/or surrogate as well as nursing, discussions with consultants, evaluation of patient's response to treatment, examination of patient, obtaining history from patient or surrogate, ordering and performing treatments and interventions, ordering and review of laboratory studies, ordering and review of radiographic studies, pulse oximetry and re-evaluation of patient's condition.     Eber Hong, MD 05/19/15 [phone]

## 2015-05-18 NOTE — H&P (Signed)
PULMONARY / CRITICAL CARE MEDICINE   Name: Anthony Beck MRN: [id number] DOB: 1975/11/06    ADMISSION DATE:  05/18/2015 CONSULTATION DATE:  05/18/2015   REFERRING MD :  Dr Eber Hong of ER  CHIEF COMPLAINT:  Coma and VDRF - alcohol intoxication  HISTORY OF PRESENT ILLNESS:   39 year old male. Hx from RN, ER doc and wife who appeared intoxicated of etoh. Patient has been drinking alcohol the whole day several pints. Then he went to the liquor store and on his way out apparently someone gave him 4 mg and ask tablets of which he 2 to and then he passed out falling to the ground and sustaining a abrasion on his left forehead along with loss of consciousness. He was brought into the emergency department. On arrival Glasgow Coma Scale reported was 6 with only gag present. He was then promptly intubated. He has been maintained on propofol infusion his blood pressures have been running high with a mean arterial pressure greater than 130 most of the time. He continues to be unresponsive. Propofol was started. Alcohol level was 228 on admission.  Of note also known to abuse crack cocaine PAST MEDICAL HISTORY :  He  has a past medical history of Knee joint pain and Alcohol abuse.  PAST SURGICAL HISTORY: He  has past surgical history that includes Cleft palate repair.  No Known Allergies  No current facility-administered medications on file prior to encounter.   No current outpatient prescriptions on file prior to encounter.    FAMILY HISTORY:  His has no family status information on file.   SOCIAL HISTORY: He  reports that he has been smoking Cigarettes.  He has been smoking about 1.00 pack per day. He has never used smokeless tobacco. He reports that he drinks about 14.4 oz of alcohol per week. He reports that he uses illicit drugs (Marijuana and Cocaine).  REVIEW OF SYSTEMS:   According to history of present illness    VITAL SIGNS: BP 169/115 mmHg  Pulse 115  Temp(Src) 97 F (36.1  C) (Axillary)  Resp 16  Ht  (1.727 m)  SpO2 100%  HEMODYNAMICS:    VENTILATOR SETTINGS: Vent Mode:  [-] PRVC FiO2 (%):  [100 %] 100 % Set Rate:  [14 bmp] 14 bmp Vt Set:  [550 mL] 550 mL PEEP:  [5 cmH20] 5 cmH20 Plateau Pressure:  [14 cmH20] 14 cmH20  INTAKE / OUTPUT:    PHYSICAL EXAMINATION: General:  Critically looking young man intubated Neuro:  RA SS sedation score -4 on propofol HEENT:  Endotracheal tube present hard collar cervical present. Pupils pinpoint.  conjunctiva suffused red Cardiovascular:  Regular rate and rhythm. Normal heart sounds tachycardic. Lungs:  Clear to auscultation bilaterally. Synchronous with the ventilator Abdomen:  Soft, no mass Musculoskeletal:  No cyanosis no clubbing no edema Skin:  Small abrasion to the left knee and the left forehead. No active bleeding  LABS: PULMONARY  Recent Labs Lab 05/18/15 2320  PHART 7.348*  PCO2ART 39.5  PO2ART 467*  HCO3 21.1  TCO2 18.8  O2SAT 99.6    CBC  Recent Labs Lab 05/18/15 2216  HGB 14.2  HCT 42.2  WBC 7.5  PLT 254    COAGULATION No results for input(s): INR in the last 168 hours.  CARDIAC   Recent Labs Lab 05/18/15 2216  TROPONINI <0.03   No results for input(s): PROBNP in the last 168 hours.   CHEMISTRY  Recent Labs Lab 05/18/15 2216  NA 143  K 3.8  CL 108  CO2 26  GLUCOSE 92  BUN 10  CREATININE 0.92  CALCIUM 9.1   CrCl cannot be calculated (Unknown ideal weight.).   LIVER  Recent Labs Lab 05/18/15 2216  AST 66*  ALT 61  ALKPHOS 68  BILITOT 0.4  PROT 7.5  ALBUMIN 4.4     INFECTIOUS No results for input(s): LATICACIDVEN, PROCALCITON in the last 168 hours.   ENDOCRINE CBG (last 3)   Recent Labs  05/18/15 2200  GLUCAP 85         IMAGING x48h  - image(s) personally visualized  -   highlighted in bold  chest x-ray personally visualized: Lung fields are clear and her tracheal tube in position     DISCUSSION:  Alcohol  intoxication with superimposed benzodiazepine plus minus cocaine. Coma. On the ventilator. Having postintubation hypertension  ASSESSMENT / PLAN:  PULMONARY A:  Acute respiratory failure secondary to alcohol intoxication  P:   Full ventilator support  CARDIOVASCULAR A:  At risk for stress MI P:  Serial troponins  RENAL A:   At risk for renal insufficiency and elect light imbalance P:   Fluid hydration and monitor  GASTROINTESTINAL A:   At risk for aspiration but no recorded aspiration event P:   Nothing by mouth  HEMATOLOGIC A:   Nil acute P:  Monitor  INFECTIOUS A:   At risk for aspiration or pneumonia but no evidence currently P:   Monitor  ENDOCRINE A:   Nil acute   P:   Monitor  Toxicology A: Alcohol and cocaine abuse P - Hydrate - Check urine toxicology, serum Tylenol and salicylate level   NEUROLOGIC A:   Coma secondary to intoxication and substance abuse. History of fall while intoxicated P:   Propofol Head CT to rule out bleed Neck CT to rule out spinal fracture Continue hard cervical collar total conscious RASS goal: 0 and -2 [currently -5]    FAMILY  - Updates: Wife updated at the bedside  - Inter-disciplinary family meet or Palliative Care meeting due by:  Seventh day which will be 05/24/2015     .  Rest per NP/medical resident whose note is outlined above and that I agree with  The patient is critically ill with multiple organ systems failure and requires high complexity decision making for assessment and support, frequent evaluation and titration of therapies, application of advanced monitoring technologies and extensive interpretation of multiple databases.   Critical Care Time devoted to patient care services described in this note is  30  Minutes. This time reflects time of care of this signee Dr Kalman ShanMurali Frederick Klinger. This critical care time does not reflect procedure time, or teaching time or supervisory time of PA/NP/Med  student/Med Resident etc but could involve care discussion time    Dr. Kalman ShanMurali Rockland Kotarski, M.D., [facility].C.C.P Pulmonary and Critical Care Medicine Staff Physician [facility] [place] [facility] Pager: [phone], If no answer or between  15:00h - 7:00h: call [phone]  05/18/2015 11:48 PM

## 2015-05-18 NOTE — Procedures (Signed)
Intubation Procedure Note Anthony Beck [id number] 01/31/1976  Procedure: Intubation Indications: Airway protection and maintenance  Procedure Details Consent: Unable to obtain consent because of altered level of consciousness. Time Out: Verified patient identification, verified procedure, site/side was marked, verified correct patient position, special equipment/implants available, medications/allergies/relevent history reviewed, required imaging and test results available.  Performed  Maximum sterile technique was used including gloves and hand hygiene.  MAC and 4    Evaluation Hemodynamic Status: BP stable throughout; O2 sats: stable throughout Patient's Current Condition: stable Complications: No apparent complications Patient did tolerate procedure well. Chest X-ray ordered to verify placement.  CXR: pending.    Ronda Fairlyicholas J [facility] LLCElinski 05/18/2015

## 2015-05-18 NOTE — ED Notes (Signed)
Pt arrived to the ED with a complaint of being unresponsive with etoh and drugs on board.  Pt's partner states he has drank three pints of alcohol, consumed two xanax  and smoked crack.

## 2015-05-19 ENCOUNTER — Inpatient Hospital Stay (HOSPITAL_COMMUNITY): Payer: Self-pay

## 2015-05-19 DIAGNOSIS — F10229 Alcohol dependence with intoxication, unspecified: Secondary | ICD-10-CM

## 2015-05-19 DIAGNOSIS — F10239 Alcohol dependence with withdrawal, unspecified: Secondary | ICD-10-CM

## 2015-05-19 DIAGNOSIS — J9601 Acute respiratory failure with hypoxia: Secondary | ICD-10-CM

## 2015-05-19 LAB — BASIC METABOLIC PANEL
Anion gap: 10 (ref 5–15)
BUN: 9 mg/dL (ref 6–20)
CO2: 22 mmol/L (ref 22–32)
CREATININE: 0.79 mg/dL (ref 0.61–1.24)
Calcium: 8.9 mg/dL (ref 8.9–10.3)
Chloride: 108 mmol/L (ref 101–111)
GFR calc Af Amer: >60 mL/min (ref >60)
GLUCOSE: 94 mg/dL (ref 65–99)
POTASSIUM: 3.4 mmol/L — AB (ref 3.5–5.1)
SODIUM: 140 mmol/L (ref 135–145)

## 2015-05-19 LAB — URINALYSIS, ROUTINE W REFLEX MICROSCOPIC
Bilirubin Urine: NEGATIVE
GLUCOSE, UA: NEGATIVE mg/dL
Hgb urine dipstick: NEGATIVE
Ketones, ur: NEGATIVE mg/dL
Leukocytes, UA: NEGATIVE
Nitrite: NEGATIVE
PH: 5.5 (ref 5.0–8.0)
Protein, ur: NEGATIVE mg/dL
Specific Gravity, Urine: 1.023 (ref 1.005–1.030)

## 2015-05-19 LAB — CBC WITH DIFFERENTIAL/PLATELET
Basophils Absolute: 0.1 10*3/uL (ref 0.0–0.1)
Basophils Relative: 1 %
EOS ABS: 0.2 10*3/uL (ref 0.0–0.7)
EOS PCT: 2 %
HCT: 43.3 % (ref 39.0–52.0)
Hemoglobin: 14.6 g/dL (ref 13.0–17.0)
LYMPHS ABS: 2.4 10*3/uL (ref 0.7–4.0)
LYMPHS PCT: 29 %
MCH: 30.5 pg (ref 26.0–34.0)
MCHC: 33.7 g/dL (ref 30.0–36.0)
MCV: 90.6 fL (ref 78.0–100.0)
MONO ABS: 0.6 10*3/uL (ref 0.1–1.0)
Monocytes Relative: 7 %
Neutro Abs: 5.1 10*3/uL (ref 1.7–7.7)
Neutrophils Relative %: 61 %
PLATELETS: 248 10*3/uL (ref 150–400)
RBC: 4.78 MIL/uL (ref 4.22–5.81)
RDW: 13.5 % (ref 11.5–15.5)
WBC: 8.4 10*3/uL (ref 4.0–10.5)

## 2015-05-19 LAB — PROTIME-INR
INR: 1.03 (ref 0.00–1.49)
PROTHROMBIN TIME: 13.7 s (ref 11.6–15.2)

## 2015-05-19 LAB — COMPREHENSIVE METABOLIC PANEL
ALT: 58 U/L (ref 17–63)
AST: 63 U/L — AB (ref 15–41)
Albumin: 4.4 g/dL (ref 3.5–5.0)
Alkaline Phosphatase: 64 U/L (ref 38–126)
Anion gap: 10 (ref 5–15)
BUN: 9 mg/dL (ref 6–20)
CHLORIDE: 110 mmol/L (ref 101–111)
CO2: 24 mmol/L (ref 22–32)
CREATININE: 0.85 mg/dL (ref 0.61–1.24)
Calcium: 8.8 mg/dL — ABNORMAL LOW (ref 8.9–10.3)
Glucose, Bld: 88 mg/dL (ref 65–99)
POTASSIUM: 3.9 mmol/L (ref 3.5–5.1)
SODIUM: 144 mmol/L (ref 135–145)
Total Bilirubin: 0.7 mg/dL (ref 0.3–1.2)
Total Protein: 7.4 g/dL (ref 6.5–8.1)

## 2015-05-19 LAB — CBC
HCT: 42.2 % (ref 39.0–52.0)
Hemoglobin: 14.2 g/dL (ref 13.0–17.0)
MCH: 29.8 pg (ref 26.0–34.0)
MCHC: 33.6 g/dL (ref 30.0–36.0)
MCV: 88.7 fL (ref 78.0–100.0)
PLATELETS: 228 10*3/uL (ref 150–400)
RBC: 4.76 MIL/uL (ref 4.22–5.81)
RDW: 13.4 % (ref 11.5–15.5)
WBC: 11.9 10*3/uL — ABNORMAL HIGH (ref 4.0–10.5)

## 2015-05-19 LAB — RAPID URINE DRUG SCREEN, HOSP PERFORMED
Amphetamines: NOT DETECTED
BARBITURATES: NOT DETECTED
BENZODIAZEPINES: POSITIVE — AB
COCAINE: POSITIVE — AB
OPIATES: NOT DETECTED
Tetrahydrocannabinol: NOT DETECTED

## 2015-05-19 LAB — BLOOD GAS, ARTERIAL
ACID-BASE DEFICIT: 1.1 mmol/L (ref 0.0–2.0)
Bicarbonate: 22 mEq/L (ref 20.0–24.0)
DRAWN BY: 11249
FIO2: 0.3
MECHVT: 550 mL
O2 Saturation: 97.1 %
PEEP/CPAP: 5 cmH2O
PH ART: 7.434 (ref 7.350–7.450)
PO2 ART: 98.5 mmHg (ref 80.0–100.0)
Patient temperature: 98.3
RATE: 14 resp/min
TCO2: 19.3 mmol/L (ref 0–100)
pCO2 arterial: 33.4 mmHg — ABNORMAL LOW (ref 35.0–45.0)

## 2015-05-19 LAB — SALICYLATE LEVEL

## 2015-05-19 LAB — LIPASE, BLOOD: LIPASE: 29 U/L (ref 11–51)

## 2015-05-19 LAB — TROPONIN I: Troponin I: <0.03 ng/mL (ref <0.031)

## 2015-05-19 LAB — AMYLASE: Amylase: 94 U/L (ref 28–100)

## 2015-05-19 LAB — LACTIC ACID, PLASMA: Lactic Acid, Venous: 2.5 mmol/L (ref 0.5–2.0)

## 2015-05-19 LAB — MRSA PCR SCREENING: MRSA by PCR: NEGATIVE

## 2015-05-19 LAB — MAGNESIUM
MAGNESIUM: 1.9 mg/dL (ref 1.7–2.4)
MAGNESIUM: 2.1 mg/dL (ref 1.7–2.4)

## 2015-05-19 LAB — ACETAMINOPHEN LEVEL

## 2015-05-19 LAB — PHOSPHORUS
PHOSPHORUS: 2.5 mg/dL (ref 2.5–4.6)
PHOSPHORUS: 3.4 mg/dL (ref 2.5–4.6)

## 2015-05-19 LAB — TRIGLYCERIDES: TRIGLYCERIDES: 130 mg/dL (ref <150)

## 2015-05-19 MED ORDER — CHLORHEXIDINE GLUCONATE 0.12% ORAL RINSE (MEDLINE KIT)
15.0000 mL | Freq: Two times a day (BID) | OROMUCOSAL | Status: DC
  Administered 2015-05-19: 15 mL via OROMUCOSAL

## 2015-05-19 MED ORDER — ACETAMINOPHEN 325 MG PO TABS
650.0000 mg | ORAL_TABLET | Freq: Four times a day (QID) | ORAL | Status: DC | PRN
  Administered 2015-05-19: 650 mg via ORAL
  Filled 2015-05-19: qty 2

## 2015-05-19 MED ORDER — CHLORDIAZEPOXIDE HCL 10 MG PO CAPS
10.0000 mg | ORAL_CAPSULE | Freq: Three times a day (TID) | ORAL | Status: DC
  Administered 2015-05-19 – 2015-05-20 (×4): 10 mg via ORAL
  Filled 2015-05-19 (×4): qty 1

## 2015-05-19 MED ORDER — PROPOFOL 1000 MG/100ML IV EMUL
0.0000 ug/kg/min | INTRAVENOUS | Status: DC
  Filled 2015-05-19: qty 100

## 2015-05-19 MED ORDER — PROPOFOL 1000 MG/100ML IV EMUL
INTRAVENOUS | Status: AC
  Filled 2015-05-19: qty 100

## 2015-05-19 MED ORDER — ANTISEPTIC ORAL RINSE SOLUTION (CORINZ)
7.0000 mL | Freq: Four times a day (QID) | OROMUCOSAL | Status: DC
  Administered 2015-05-19: 7 mL via OROMUCOSAL

## 2015-05-19 MED ORDER — DEXMEDETOMIDINE HCL IN NACL 200 MCG/50ML IV SOLN
0.4000 ug/kg/h | INTRAVENOUS | Status: DC
  Administered 2015-05-19: 1.2 ug/kg/h via INTRAVENOUS
  Administered 2015-05-19: 2 ug/kg/h via INTRAVENOUS
  Administered 2015-05-19: 0.4 ug/kg/h via INTRAVENOUS
  Filled 2015-05-19 (×3): qty 50

## 2015-05-19 MED ORDER — INFLUENZA VAC SPLIT QUAD 0.5 ML IM SUSY
0.5000 mL | PREFILLED_SYRINGE | INTRAMUSCULAR | Status: AC
  Administered 2015-05-20: 0.5 mL via INTRAMUSCULAR
  Filled 2015-05-19 (×2): qty 0.5

## 2015-05-19 MED ORDER — NICOTINE 21 MG/24HR TD PT24
21.0000 mg | MEDICATED_PATCH | Freq: Every day | TRANSDERMAL | Status: DC
  Administered 2015-05-19: 21 mg via TRANSDERMAL
  Filled 2015-05-19: qty 1

## 2015-05-19 MED ORDER — LORAZEPAM 2 MG/ML IJ SOLN
0.5000 mg | INTRAMUSCULAR | Status: DC | PRN
  Administered 2015-05-19: 1 mg via INTRAVENOUS
  Filled 2015-05-19: qty 1

## 2015-05-19 MED ORDER — PROPOFOL 1000 MG/100ML IV EMUL
5.0000 ug/kg/min | Freq: Once | INTRAVENOUS | Status: DC
  Administered 2015-05-19: 60 ug/kg/min via INTRAVENOUS

## 2015-05-19 MED ORDER — PNEUMOCOCCAL VAC POLYVALENT 25 MCG/0.5ML IJ INJ
0.5000 mL | INJECTION | INTRAMUSCULAR | Status: AC
  Administered 2015-05-20: 0.5 mL via INTRAMUSCULAR
  Filled 2015-05-19 (×2): qty 0.5

## 2015-05-19 MED ORDER — FENTANYL CITRATE (PF) 100 MCG/2ML IJ SOLN: 12.5000 ug | INTRAMUSCULAR | Status: DC | PRN

## 2015-05-19 NOTE — Procedures (Signed)
Extubation Procedure Note  Patient Details:   Name: Lala LundDonny Dziuba DOB: 07/06/1975 MRN: [id number]   Airway Documentation:  Airway 8 mm (Active)  Secured at (cm) 26 cm 05/19/2015  7:37 AM  Measured From Lips 05/19/2015  7:37 AM  Secured Location Center 05/19/2015  7:37 AM  Secured By [employer] Tube Holder 05/19/2015  7:37 AM  Tube Holder Repositioned Yes 05/19/2015  7:37 AM  Cuff Pressure (cm H2O) 28 cm H2O 05/19/2015  1:31 AM  Site Condition Dry 05/18/2015 10:55 PM    Evaluation  O2 sats: 91  Complications: none Patient tolerated procedure well. Bilateral Breath Sounds: Clear, Diminished Suctioning: Oral Pt able to speak  Per CCM order pt extubated, placed on nasal cannula.  Revonda HumphreyDePue, Taylin Mans S 05/19/2015, 8:07 AM

## 2015-05-19 NOTE — Progress Notes (Addendum)
PULMONARY / CRITICAL CARE MEDICINE   Name: Anthony Beck MRN: [id number] DOB: [birth year]    ADMISSION DATE:  05/18/2015 CONSULTATION DATE:  05/18/2015   REFERRING MD :  Dr Anthony HongBrian Beck of ER  CHIEF COMPLAINT:  Coma and VDRF - alcohol intoxication  Brief:   39 y/o male [facility]/29 after he apparently took Benzodiazepines while intoxicated with alcohol.  Also has a history of crack cocaine use.  Subjective: intermittently agitated on the ventilator overnight, requiring propofol and versed prn   VITAL SIGNS: BP 126/73 mmHg  Pulse 106  Temp(Src) 97 F (36.1 C) (Axillary)  Resp 14  Ht 5\' 8"  (1.727 m)  Wt 125 lb 10.6 oz (57 kg)  BMI 19.11 kg/m2  SpO2 95%  HEMODYNAMICS:    VENTILATOR SETTINGS: Vent Mode:  [-] CPAP FiO2 (%):  [30 %-100 %] 30 % Set Rate:  [14 bmp] 14 bmp Vt Set:  [550 mL] 550 mL PEEP:  [5 cmH20] 5 cmH20 Pressure Support:  [5 cmH20] 5 cmH20 Plateau Pressure:  [14 cmH20-16 cmH20] 16 cmH20  INTAKE / OUTPUT: I/O last 3 completed shifts: In: 203.4 [I.V.:143.4; NG/GT:60] Out: 535 [Urine:285; Emesis/NG output:250]  PHYSICAL EXAMINATION: General:  Intubated, no distress HENT: soft cervical collar in place, NCAT, EOMi, PERRL PULM: vent supported breaths, CTA B CV: Tachy, regular, no murmur, gallop, rub GI: BS+, soft,  MSK: normal bulk and tone Neuro: awake, on vent, intermittently agitated but follows commands and moves all four ext  LABS: PULMONARY  Recent Labs Lab 05/18/15 2320 05/19/15 0500  PHART 7.348* 7.434  PCO2ART 39.5 33.4*  PO2ART 467* 98.5  HCO3 21.1 22.0  TCO2 18.8 19.3  O2SAT 99.6 97.1    CBC  Recent Labs Lab 05/18/15 2216 05/19/15 0009 05/19/15 0640  HGB 14.2 14.6 14.2  HCT 42.2 43.3 42.2  WBC 7.5 8.4 11.9*  PLT 254 248 228    COAGULATION  Recent Labs Lab 05/19/15 0009  INR 1.03    CARDIAC    Recent Labs Lab 05/18/15 2216 05/19/15 0009 05/19/15 0640  TROPONINI <0.03  <0.03 <0.03   No results for input(s): PROBNP in the last 168 hours.   CHEMISTRY  Recent Labs Lab 05/18/15 2216 05/19/15 0009 05/19/15 0640  NA 143 144 140  K 3.8 3.9 3.4*  CL 108 110 108  CO2 26 24 22   GLUCOSE 92 88 94  BUN 10 9 9   CREATININE 0.92 0.85 0.79  CALCIUM 9.1 8.8* 8.9  MG  --  2.1 1.9  PHOS  --  3.4 2.5   Estimated Creatinine Clearance: 98 mL/min (by C-G formula based on Cr of 0.79).   LIVER  Recent Labs Lab 05/18/15 2216 05/19/15 0009  AST 66* 63*  ALT 61 58  ALKPHOS 68 64  BILITOT 0.4 0.7  PROT 7.5 7.4  ALBUMIN 4.4 4.4  INR  --  1.03     INFECTIOUS  Recent Labs Lab 05/19/15 0009  LATICACIDVEN 2.5*     ENDOCRINE CBG (last 3)   Recent Labs  05/18/15 2200  GLUCAP 85     11/29 CXR personally reviewed > ett in place, R base atelectasis 11/29 C-spine and CT head > no acute process   DISCUSSION: [name] was intoxicated with alcohol when he allegedly took benzodiazepines that caused sufficient respiratory suppression for him to [facility].  He is more awake on 11/30, has no respiratory mechanic or  oxygenation problems on 11/30.  He remains agitated and is likely in alcohol withdrawal  ASSESSMENT / PLAN:  PULMONARY A:  Acute respiratory failure secondary to alcohol intoxication > resolved P:   Extubate NPO for four hours afterwards then advance diet  CARDIOVASCULAR A:  Sinus tachycardia due to alcohol withdrawal P:  Tele monitoring Treat withdrawal  RENAL A:   Hypokalemia P:   Monitor BMET and UOP Replace electrolytes as needed  GASTROINTESTINAL A:   No acute issues P:   Advance diet post extubation  HEMATOLOGIC A:   No acute issues P:  Monitor for bleeding  INFECTIOUS A:   No acute issues P:   Monitor for fever  ENDOCRINE A:   No acute issues P:   Monitor glucose  Toxicology A: Presumed benzodiazepine overdose Cocaine abuse P Telemetry  monitoring  NEUROLOGIC A:   Acute encephalopathy > improved but still somewhat agitated Alcohol withdrawal  P:   D/c propofol Use ativan as needed for signs of alcohol withdrawal Use librium    FAMILY  - Updates: none bedside 11/30  - Inter-disciplinary family meet or Palliative Care meeting due by:  Seventh day which will be 05/24/2015    The patient is critically ill with multiple organ systems failure and requires high complexity decision making for assessment and support, frequent evaluation and titration of therapies, application of advanced monitoring technologies and extensive interpretation of multiple databases.   Critical Care Time devoted to patient care services described in this note is  32  Minutes.   Anthony [place], MD [place] [facility] Pager: [phone] Cell: [phone] After 3pm or if no response, call [phone]  05/19/2015 8:08 AM

## 2015-05-19 NOTE — Progress Notes (Signed)
Initial Nutrition Assessment  DOCUMENTATION CODES:   Underweight  INTERVENTION:   Diet advancement per MD  Provide ensure with diet advancement   NUTRITION DIAGNOSIS:   Increased nutrient needs related to  (alcohol abuse) as evidenced by estimated needs, percent weight loss.  GOAL:   Patient will meet greater than or equal to 90% of their needs  MONITOR:   PO intake, I & O's, Labs, Diet advancement  REASON FOR ASSESSMENT:   Malnutrition Screening Tool    ASSESSMENT:   39 year old male. Hx from RN, ER doc and wife who appeared intoxicated of etoh. Patient has been drinking alcohol the whole day several pints. Then he went to the liquor store and on his way out apparently someone gave him 4 mg and ask tablets of which he 2 to and then he passed out falling to the ground and sustaining a abrasion on his left forehead along with loss of consciousness. He was brought into the emergency department. On arrival Glasgow Coma Scale reported was 6 with only gag present. He was then promptly intubated. He has been maintained on propofol infusion his blood pressures have been running high with a mean arterial pressure greater than 130 most of the time. He continues to be unresponsive. Propofol was started. Alcohol level was 228 on admission.  Spoke with pt's fiance at bedside. He has been extubated, currently on venturi mask. No longer on propofol, receiving Versed PRN. Pt has a hx of alcohol abuse, but fiance reports pt still has a good appetite/eats even with drinking. Pt did state they walk a large amount. Unsure of social environment.  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 Would explain 25#/16% severe wt loss in 10 months.   Nutrition-Focused physical exam completed. Findings are no fat depletion, no muscle depletion, and no edema.   Follow for diet advancement.  Labs and medications reviewed.  Diet Order:  Diet NPO time  specified  Skin:  Reviewed, no issues  Last BM:  PTA  Height:   Ht Readings from Last 1 Encounters:  05/19/15 5\' 8"  (1.727 m)    Weight:   Wt Readings from Last 1 Encounters:  05/19/15 125 lb 10.6 oz (57 kg)    Ideal Body Weight:  70 kg  BMI:  Body mass index is 19.11 kg/(m^2).  Estimated Nutritional Needs:   Kcal:  1800 calories  Protein:  70-85 grams  Fluid:  >/= 1.8L  EDUCATION NEEDS:   No education needs identified at this time  Dionne AnoWilliam M. Kalyan Barabas, MS, RD LDN After Hours/Weekend Pager [phone]

## 2015-05-19 NOTE — ED Notes (Signed)
Report to Dione Housekeeperheryl RN, Consulting civil engineerCharge RN

## 2015-05-19 NOTE — Progress Notes (Signed)
eLink Physician-Brief Progress Note Patient Name: Anthony Beck DOB: Jan 24, 1976 MRN: [id number]   Date of Service  05/19/2015  HPI/Events of Note  Multiple issues: 1. Patient requests Tylenol for HA and 2. Nicotine Patch.   eICU Interventions  Will order: 1. Tylenol 650 mg PO Q 6 hours PRN 2. Nicotine patch 21 mg Q day.     Intervention Category Intermediate Interventions: Pain - evaluation and management;Other:  Avian Greenawalt Dennard Nipugene 05/19/2015, 7:50 PM

## 2015-05-19 NOTE — Care Management Note (Signed)
Case Management Note  Patient Details  Name: Anthony Beck MRN: [id number] Date of Birth: 15-May-1976  Subjective/Objective:            etoh intoxication and unable to protect airway-intubated on [id number] and extubated am of [id number]-remains on high flow o2.        Action/Plan:Date: May 19, 2015 Chart reviewed for concurrent status and case management needs. Will continue to follow patient for changes and needs: Marcelle Smilinghonda Davis, RN, BSN, [place]   [phone]   Expected Discharge Date:                  Expected Discharge Plan:  Home/Self Care  In-House Referral:  Clinical Social Work  Discharge planning Services  CM Consult  Post Acute Care Choice:  NA Choice offered to:  NA  DME Arranged:    DME Agency:     HH Arranged:    HH Agency:     Status of Service:  In process, will continue to follow  Medicare Important Message Given:    Date Medicare IM Given:    Medicare IM give by:    Date Additional Medicare IM Given:    Additional Medicare Important Message give by:     If discussed at Long Length of Stay Meetings, dates discussed:    Additional Comments:  Golda AcreDavis, Rhonda Lynn, RN 05/19/2015, 9:53 AM

## 2015-05-19 NOTE — Progress Notes (Addendum)
Per Dr. Kendrick FriesMcQuaid, okay to remove C-collar.

## 2015-05-20 LAB — BASIC METABOLIC PANEL
Anion gap: 6 (ref 5–15)
BUN: 9 mg/dL (ref 6–20)
CALCIUM: 8.7 mg/dL — AB (ref 8.9–10.3)
CHLORIDE: 106 mmol/L (ref 101–111)
CO2: 27 mmol/L (ref 22–32)
CREATININE: 0.93 mg/dL (ref 0.61–1.24)
GFR calc Af Amer: >60 mL/min (ref >60)
GFR calc non Af Amer: >60 mL/min (ref >60)
GLUCOSE: 113 mg/dL — AB (ref 65–99)
Potassium: 3.8 mmol/L (ref 3.5–5.1)
Sodium: 139 mmol/L (ref 135–145)

## 2015-05-20 NOTE — Progress Notes (Signed)
Pt d/c prior to being seen by CSW. CSW did provide spouse with meal vouchers, [employer] for free meals, [employer] info and shelter list on 05/19/15. NS[facility] provided pt with bus pass today.   Cori RazorJamie Aleicia Kenagy LCSW [phone]

## 2015-05-20 NOTE — Progress Notes (Signed)
Px discharged, given meal voucher and bus pass from social work.  Discussed discharge instructions with patient and wife.

## 2015-05-20 NOTE — Discharge Summary (Signed)
Physician Discharge Summary       Patient ID: Anthony Beck MRN: [id number] DOB/AGE: 20-Jan-1976 39 y.o.  Admit date: 05/18/2015 Discharge date: 05/20/2015  Discharge Diagnoses:   Acute Hypoxic Respiratory Failure  Atelectasis  Sinus tachycardia  Acute Encephalopathy d/t alcohol intoxication & unintentional benzodiazapine overdose  Hypokalemia  Possible ETOH w/d Detailed Hospital Course:  39 year old male. Hx from RN, ER doc and wife who appeared intoxicated of etoh. Patient had been drinking alcohol the whole day several pints. Then he went to the liquor store and on his way out apparently someone gave him 4 mg xanax tablets of which he 2 to and then he passed out falling to the ground and sustaining a abrasion on his left forehead along with loss of consciousness. He was brought into the emergency department. On arrival Glasgow Coma Scale reported was 6 with only gag present. He was then promptly intubated. He was  maintained on propofol infusion his blood pressures have been running high with a mean arterial pressure greater than 130 most of the time. Alcohol level was 228 on admission. He was admitted to the intensive care. Treated supportively w/ IV hydration and mechanical ventilation. He was successfully extubated the following morning. He was initially confused and hypoxic after extubation. His CXR showed RLL atelectasis. His care was continued & his oxygen was weaned to off precedex infusion which was started due to agitation s/p extubation was weaned to off and on day of discharge he was at cognitive status and was no longer on O2.    Discharge Plan by active problems   Resolved acute encephalopathy in setting of Alcohol intoxication and unintentional benzodiazapine overdose.  Plan Home today Advised to avoid none provider prescribed medications and decrease alcohol consumption     F/u with PCP as needed    Significant Hospital tests/ studies  Consults: none   Discharge  Exam: BP 141/84 mmHg  Pulse 107  Temp(Src) 98.1 F (36.7 C) (Oral)  Resp 13  Ht  (1.727 m)  Wt 56.9 kg (125 lb 7.1 oz)  BMI 19.08 kg/m2  SpO2 96%  General:awake, alert, no focal def  HENT: MMM EOMI.  PULM: clear and w/out accessory muscle use CV: Tachy, regular, no murmur, gallop, rub GI: BS+, soft,  MSK: normal bulk and tone Neuro: awake, on vent, intermittently agitated but follows commands and moves all four ext  Labs at discharge Lab Results  Component Value Date   CREATININE 0.93 05/20/2015   BUN 9 05/20/2015   NA 139 05/20/2015   K 3.8 05/20/2015   CL 106 05/20/2015   CO2 27 05/20/2015   Lab Results  Component Value Date   WBC 11.9* 05/19/2015   HGB 14.2 05/19/2015   HCT 42.2 05/19/2015   MCV 88.7 05/19/2015   PLT 228 05/19/2015   Lab Results  Component Value Date   ALT 58 05/19/2015   AST 63* 05/19/2015   ALKPHOS 64 05/19/2015   BILITOT 0.7 05/19/2015   Lab Results  Component Value Date   INR 1.03 05/19/2015    Current radiology studies Ct Head Wo Contrast  05/19/2015  CLINICAL DATA:  Found unresponsive.  Abrasion on forehead. EXAM: CT HEAD WITHOUT CONTRAST CT CERVICAL SPINE WITHOUT CONTRAST TECHNIQUE: Multidetector CT imaging of the head and cervical spine was performed following the standard protocol without intravenous contrast. Multiplanar CT image reconstructions of the cervical spine were also generated. COMPARISON:  None. FINDINGS: CT HEAD FINDINGS There is no intracranial hemorrhage, mass  or evidence of acute infarction. There is no extra-axial fluid collection. There is mild generalized atrophy. No bone abnormality. Visible paranasal sinuses are clear. Chronic appearing mastoid sclerosis, left greater than right. CT CERVICAL SPINE FINDINGS The vertebral column, pedicles and facet articulations are intact. There is no evidence of acute fracture. No acute soft tissue abnormalities are evident. No significant arthritic changes are evident.  IMPRESSION: 1. Negative for acute intracranial traumatic injury. There is mild generalized atrophy. 2. Negative for acute cervical spine fracture. Electronically Signed   By: Ellery Plunkaniel R Mitchell M.D.   On: 05/19/2015 01:07   Ct Cervical Spine Wo Contrast  05/19/2015  CLINICAL DATA:  Found unresponsive.  Abrasion on forehead. EXAM: CT HEAD WITHOUT CONTRAST CT CERVICAL SPINE WITHOUT CONTRAST TECHNIQUE: Multidetector CT imaging of the head and cervical spine was performed following the standard protocol without intravenous contrast. Multiplanar CT image reconstructions of the cervical spine were also generated. COMPARISON:  None. FINDINGS: CT HEAD FINDINGS There is no intracranial hemorrhage, mass or evidence of acute infarction. There is no extra-axial fluid collection. There is mild generalized atrophy. No bone abnormality. Visible paranasal sinuses are clear. Chronic appearing mastoid sclerosis, left greater than right. CT CERVICAL SPINE FINDINGS The vertebral column, pedicles and facet articulations are intact. There is no evidence of acute fracture. No acute soft tissue abnormalities are evident. No significant arthritic changes are evident. IMPRESSION: 1. Negative for acute intracranial traumatic injury. There is mild generalized atrophy. 2. Negative for acute cervical spine fracture. Electronically Signed   By: Ellery Plunkaniel R Mitchell M.D.   On: 05/19/2015 01:07   Dg Chest Port 1 View  05/19/2015  CLINICAL DATA:  Intubation. EXAM: PORTABLE CHEST 1 VIEW COMPARISON:  05/18/2015. FINDINGS: Endotracheal tube, and NG tube in stable position. Right base subsegmental atelectasis and or infiltrate. No pleural effusion pneumothorax. Heart size normal. No acute bony abnormality . IMPRESSION: 1. Lines and tubes in stable position. 2. Base subsegment atelectasis and or infiltrate . Electronically Signed   By: Maisie Fushomas  Register   On: 05/19/2015 07:10   Dg Chest Portable 1 View  05/18/2015  CLINICAL DATA:  39 year old male  status post intubation an enteric tube placement. EXAM: PORTABLE CHEST 1 VIEW COMPARISON:  None. FINDINGS: An endotracheal tube is noted with tip approximately 3 cm above the carina. An enteric tube is partially visualized coursing into the left upper abdomen. The tip of the enteric tube is beyond the image cut off. There is linear subsegmental atelectatic changes of the right lung base. There is no focal consolidation, pleural effusion, or pneumothorax. The cardiac silhouette is within normal limits. The visualized osseous structures are grossly unremarkable. IMPRESSION: No acute cardiopulmonary process. Support tubes as described. Electronically Signed   By: Elgie CollardArash  Radparvar M.D.   On: 05/18/2015 23:51    Disposition:  01-Home or Self Care      Discharge Instructions    Diet - low sodium heart healthy    Complete by:  As directed      Increase activity slowly    Complete by:  As directed             Medication List    STOP taking these medications        alprazolam 2 MG tablet  Commonly known as:  XANAX       Follow-up Information    Schedule an appointment as soon as possible for a visit with with your primary MD .   Why:  As needed  Discharged Condition: good  Physician Statement:   The Patient was personally examined, the discharge assessment and plan has been personally reviewed and I agree with ACNP Ronette Hank's assessment and plan. > 30 minutes of time have been dedicated to discharge assessment, planning and discharge instructions.   Signed: Shelby Mattocks 05/20/2015, 9:16 AM

## 2015-07-02 ENCOUNTER — Emergency Department
Admission: EM | Admit: 2015-07-02 | Discharge: 2015-07-03 | Payer: Self-pay | Attending: Emergency Medicine | Admitting: Emergency Medicine

## 2015-07-02 DIAGNOSIS — F10120 Alcohol abuse with intoxication, uncomplicated: Secondary | ICD-10-CM | POA: Insufficient documentation

## 2015-07-02 DIAGNOSIS — F17210 Nicotine dependence, cigarettes, uncomplicated: Secondary | ICD-10-CM | POA: Insufficient documentation

## 2015-07-02 DIAGNOSIS — F10920 Alcohol use, unspecified with intoxication, uncomplicated: Secondary | ICD-10-CM

## 2015-07-02 DIAGNOSIS — F1410 Cocaine abuse, uncomplicated: Secondary | ICD-10-CM | POA: Insufficient documentation

## 2015-07-02 LAB — COMPREHENSIVE METABOLIC PANEL
ALT: 31 U/L (ref 17–63)
ANION GAP: 10 (ref 5–15)
AST: 35 U/L (ref 15–41)
Albumin: 4.6 g/dL (ref 3.5–5.0)
Alkaline Phosphatase: 66 U/L (ref 38–126)
BILIRUBIN TOTAL: 0.2 mg/dL — AB (ref 0.3–1.2)
BUN: 8 mg/dL (ref 6–20)
CHLORIDE: 107 mmol/L (ref 101–111)
CO2: 24 mmol/L (ref 22–32)
Calcium: 8.9 mg/dL (ref 8.9–10.3)
Creatinine, Ser: 0.84 mg/dL (ref 0.61–1.24)
GFR calc Af Amer: >60 mL/min (ref >60)
GFR calc non Af Amer: >60 mL/min (ref >60)
GLUCOSE: 92 mg/dL (ref 65–99)
POTASSIUM: 4.1 mmol/L (ref 3.5–5.1)
SODIUM: 141 mmol/L (ref 135–145)
TOTAL PROTEIN: 7.6 g/dL (ref 6.5–8.1)

## 2015-07-02 LAB — URINE DRUG SCREEN, QUALITATIVE (ARMC ONLY)
Amphetamines, Ur Screen: NOT DETECTED
BARBITURATES, UR SCREEN: NOT DETECTED
Benzodiazepine, Ur Scrn: NOT DETECTED
COCAINE METABOLITE, UR ~~LOC~~: POSITIVE — AB
Cannabinoid 50 Ng, Ur ~~LOC~~: NOT DETECTED
MDMA (Ecstasy)Ur Screen: NOT DETECTED
METHADONE SCREEN, URINE: NOT DETECTED
Opiate, Ur Screen: NOT DETECTED
Phencyclidine (PCP) Ur S: NOT DETECTED
TRICYCLIC, UR SCREEN: NOT DETECTED

## 2015-07-02 LAB — CBC
HCT: 43.7 % (ref 40.0–52.0)
HEMOGLOBIN: 14.5 g/dL (ref 13.0–18.0)
MCH: 29.7 pg (ref 26.0–34.0)
MCHC: 33.1 g/dL (ref 32.0–36.0)
MCV: 89.6 fL (ref 80.0–100.0)
Platelets: 251 10*3/uL (ref 150–440)
RBC: 4.88 MIL/uL (ref 4.40–5.90)
RDW: 14.1 % (ref 11.5–14.5)
WBC: 7 10*3/uL (ref 3.8–10.6)

## 2015-07-02 LAB — ETHANOL
Alcohol, Ethyl (B): 311 mg/dL (ref <5)
Alcohol, Ethyl (B): 215 mg/dL — ABNORMAL HIGH (ref <5)

## 2015-07-02 MED ORDER — SODIUM CHLORIDE 0.9 % IV BOLUS (SEPSIS)
1000.0000 mL | Freq: Once | INTRAVENOUS | Status: AC
  Administered 2015-07-02: 1000 mL via INTRAVENOUS

## 2015-07-02 NOTE — Discharge Instructions (Signed)
Your alcohol level was initially over 300. After some time and 1 L of IV fluid, this level has come down to less than 300. Seek help with your alcohol abuse problem. You can follow-up at [facility] or with another service that helps people with alcohol or addiction problems. Return the emergency department if you have any urgent concerns.  Alcohol Use Disorder Alcohol use disorder is a mental disorder. It is not a one-time incident of heavy drinking. Alcohol use disorder is the excessive and uncontrollable use of alcohol over time that leads to problems with functioning in one or more areas of daily living. People with this disorder risk harming themselves and others when they drink to excess. Alcohol use disorder also can cause other mental disorders, such as mood and anxiety disorders, and serious physical problems. People with alcohol use disorder often misuse other drugs.  Alcohol use disorder is common and widespread. Some people with this disorder drink alcohol to cope with or escape from negative life events. Others drink to relieve chronic pain or symptoms of mental illness. People with a family history of alcohol use disorder are at higher risk of losing control and using alcohol to excess.  Drinking too much alcohol can cause injury, accidents, and health problems. One drink can be too much when you are:  Working.  Pregnant or breastfeeding.  Taking medicines. Ask your doctor.  Driving or planning to drive. SYMPTOMS  Signs and symptoms of alcohol use disorder may include the following:   Consumption ofalcohol inlarger amounts or over a longer period of time than intended.  Multiple unsuccessful attempts to cutdown or control alcohol use.   A great deal of time spent obtaining alcohol, using alcohol, or recovering from the effects of alcohol (hangover).  A strong desire or urge to use alcohol (cravings).   Continued use of alcohol despite problems at work, school, or home because of  alcohol use.   Continued use of alcohol despite problems in relationships because of alcohol use.  Continued use of alcohol in situations when it is physically hazardous, such as driving a car.  Continued use of alcohol despite awareness of a physical or psychological problem that is likely related to alcohol use. Physical problems related to alcohol use can involve the brain, heart, liver, stomach, and intestines. Psychological problems related to alcohol use include intoxication, depression, anxiety, psychosis, delirium, and dementia.   The need for increased amounts of alcohol to achieve the same desired effect, or a decreased effect from the consumption of the same amount of alcohol (tolerance).  Withdrawal symptoms upon reducing or stopping alcohol use, or alcohol use to reduce or avoid withdrawal symptoms. Withdrawal symptoms include:  Racing heart.  Hand tremor.  Difficulty sleeping.  Nausea.  Vomiting.  Hallucinations.  Restlessness.  Seizures. DIAGNOSIS Alcohol use disorder is diagnosed through an assessment by your health care provider. Your health care provider may start by asking three or four questions to screen for excessive or problematic alcohol use. To confirm a diagnosis of alcohol use disorder, at least two symptoms must be present within a 62-month period. The severity of alcohol use disorder depends on the number of symptoms:  Mild--two or three.  Moderate--four or five.  Severe--six or more. Your health care provider may perform a physical exam or use results from lab tests to see if you have physical problems resulting from alcohol use. Your health care provider may refer you to a mental health professional for evaluation. TREATMENT  Some people with  alcohol use disorder are able to reduce their alcohol use to low-risk levels. Some people with alcohol use disorder need to quit drinking alcohol. When necessary, mental health professionals with specialized  training in substance use treatment can help. Your health care provider can help you decide how severe your alcohol use disorder is and what type of treatment you need. The following forms of treatment are available:   Detoxification. Detoxification involves the use of prescription medicines to prevent alcohol withdrawal symptoms in the first week after quitting. This is important for people with a history of symptoms of withdrawal and for heavy drinkers who are likely to have withdrawal symptoms. Alcohol withdrawal can be dangerous and, in severe cases, cause death. Detoxification is usually provided in a hospital or in-patient substance use treatment facility.  Counseling or talk therapy. Talk therapy is provided by substance use treatment counselors. It addresses the reasons people use alcohol and ways to keep them from drinking again. The goals of talk therapy are to help people with alcohol use disorder find healthy activities and ways to cope with life stress, to identify and avoid triggers for alcohol use, and to handle cravings, which can cause relapse.  Medicines.Different medicines can help treat alcohol use disorder through the following actions:  Decrease alcohol cravings.  Decrease the positive reward response felt from alcohol use.  Produce an uncomfortable physical reaction when alcohol is used (aversion therapy).  Support groups. Support groups are run by people who have quit drinking. They provide emotional support, advice, and guidance. These forms of treatment are often combined. Some people with alcohol use disorder benefit from intensive combination treatment provided by specialized substance use treatment centers. Both inpatient and outpatient treatment programs are available.   This information is not intended to replace advice given to you by your health care provider. Make sure you discuss any questions you have with your health care provider.   Document Released:  07/13/2004 Document Revised: 06/26/2014 Document Reviewed: 09/12/2012 Elsevier Interactive Patient Education [employer]2016 Elsevier Inc.

## 2015-07-02 NOTE — ED Notes (Signed)
Lab called, pt. Ethanol level at 0.311

## 2015-07-02 NOTE — ED Notes (Signed)
Pt. Being discharged to [employer].

## 2015-07-02 NOTE — ED Notes (Signed)
Pt. In 20 hallway bed, sleeping at this time.

## 2015-07-02 NOTE — ED Notes (Signed)
BEHAVIORAL HEALTH ROUNDING Patient sleeping: Yes.   Patient alert and oriented: no Behavior appropriate: Yes.  ; If no, describe:  Nutrition and fluids offered: Yes  Toileting and hygiene offered: Yes  Sitter present: no Law enforcement present: Yes  

## 2015-07-02 NOTE — ED Notes (Signed)
Pt brought to ED by [employer] officer [place]abe for medical clearance for jail. Per officer pt blew .33 and the limit for the jail is .30. Pt denies co's.

## 2015-07-02 NOTE — ED Provider Notes (Signed)
[facility] Emergency Department Provider Note  ____________________________________________  Time seen: 8:20 PM  I have reviewed the triage vital signs and the nursing notes.  History by:  Police. History by patient is limited.  HISTORY  Chief Complaint Medical Clearance     HPI Anthony Beck is a 40 y.o. male who is been brought to the emergency department by police. He is currently under arrest. During police evaluation, they did a breathalyzer test and found his alcohol level was greater than 300. This is over the limit for the jail. His been sent to the emergency department for medical evaluation.  The patient appears inebriated. He is sleeping when I first see him. He awakens slowly when roused. He provides a limited history. He denies any acute pain or problem.   Past Medical History  Diagnosis Date  . Knee joint pain   . Alcohol abuse     Patient Active Problem List   Diagnosis Date Noted  . Acute respiratory failure (HCC) 05/18/2015  . Coma (HCC) 05/18/2015  . Alcohol intoxication (HCC) 05/18/2015  . Overdose   . Alcohol abuse 04/08/2013  . S/P alcohol detoxification 04/08/2013    Past Surgical History  Procedure Laterality Date  . Cleft palate repair      No current outpatient prescriptions on file.  Allergies Review of patient's allergies indicates no known allergies.  Family History  Problem Relation Age of Onset  . Cancer Other     Social History Social History  Substance Use Topics  . Smoking status: Current Every Day Smoker -- 1.00 packs/day    Types: Cigarettes  . Smokeless tobacco: Never Used  . Alcohol Use: 14.4 oz/week    24 Cans of beer per week     Comment: drinks liquor occasionally    Review of Systems Review of systems Limited due to the the patient's intoxication.  ____________________________________________   PHYSICAL EXAM:  VITAL SIGNS: ED Triage Vitals  Enc Vitals Group     BP 07/02/15 1917  141/85 mmHg     Pulse Rate 07/02/15 1917 99     Resp 07/02/15 1917 18     Temp 07/02/15 1917 98.1 F (36.7 C)     Temp Source 07/02/15 1917 Oral     SpO2 07/02/15 1917 96 %     Weight 07/02/15 1917 135 lb (61.236 kg)     Height 07/02/15 1917 5\' 8"  (1.727 m)     Head Cir --      Peak Flow --      Pain Score 07/02/15 1918 0     Pain Loc --      Pain Edu? --      Excl. in GC? --     Constitutional: Somnolent. Awakens and appears inebriated. No distress. ENT   Head: Normocephalic and atraumatic. Cardiovascular: Normal rate, regular rhythm, no murmur noted Respiratory:  Normal respiratory effort, no tachypnea.    Breath sounds are clear and equal bilaterally.  Gastrointestinal: Soft, no distention. Nontender Back: No muscle spasm, no tenderness, no CVA tenderness. Musculoskeletal: No deformity noted. Nontender with normal range of motion in all extremities.  No noted edema. Neurologic:  Appears intoxicated. Somnolent, but rouses to voice and stimuli. Moves all 4 extremities with no noted limitation. Skin:  Skin is warm, dry. No rash noted. Psychiatric: Appears intoxicated. Limited communication.  ____________________________________________    LABS (pertinent positives/negatives)  Labs Reviewed  COMPREHENSIVE METABOLIC PANEL - Abnormal; Notable for the following:    Total Bilirubin  0.2 (*)    All other components within normal limits  ETHANOL - Abnormal; Notable for the following:    Alcohol, Ethyl (B) 311 (*)    All other components within normal limits  URINE DRUG SCREEN, QUALITATIVE ([facility] ONLY) - Abnormal; Notable for the following:    Cocaine Metabolite,[place] POSITIVE (*)    All other components within normal limits  CBC  ETHANOL     ____________________________________________  ____________________________________________   INITIAL IMPRESSION / ASSESSMENT AND PLAN / ED COURSE  Pertinent labs & imaging results that were available during my care of the patient  were reviewed by me and considered in my medical decision making (see chart for details).  40 year old male with acute alcohol intoxication. His alcohol level in the emergency department is 311. He also has noted cocaine on a urine drug screen.  With the elevated alcohol level, we have started an IV and given him 1 L of normal saline.  We will repeat a call level to confirm for the jail that the patient is at a level they are comfortable with.  ____________________________________________   FINAL CLINICAL IMPRESSION(S) / ED DIAGNOSES  Final diagnoses:  Acute alcohol intoxication, uncomplicated (HCC)      Darien Ramusavid W Codylee Patil, MD 07/02/15 2304

## 2015-07-02 NOTE — ED Notes (Signed)
Gave pt. Meal tray and some OJ. Pt. Eating and drinking at this time.

## 2015-11-10 IMAGING — CR DG HAND COMPLETE 3+V*R*
3 series · 3 of 3 positions shown · non-contrast
Comparison: None.

CLINICAL DATA: Injury, pain and swelling

EXAM:
RIGHT HAND - COMPLETE 3+ VIEW

[view not recorded (1 of 3)]
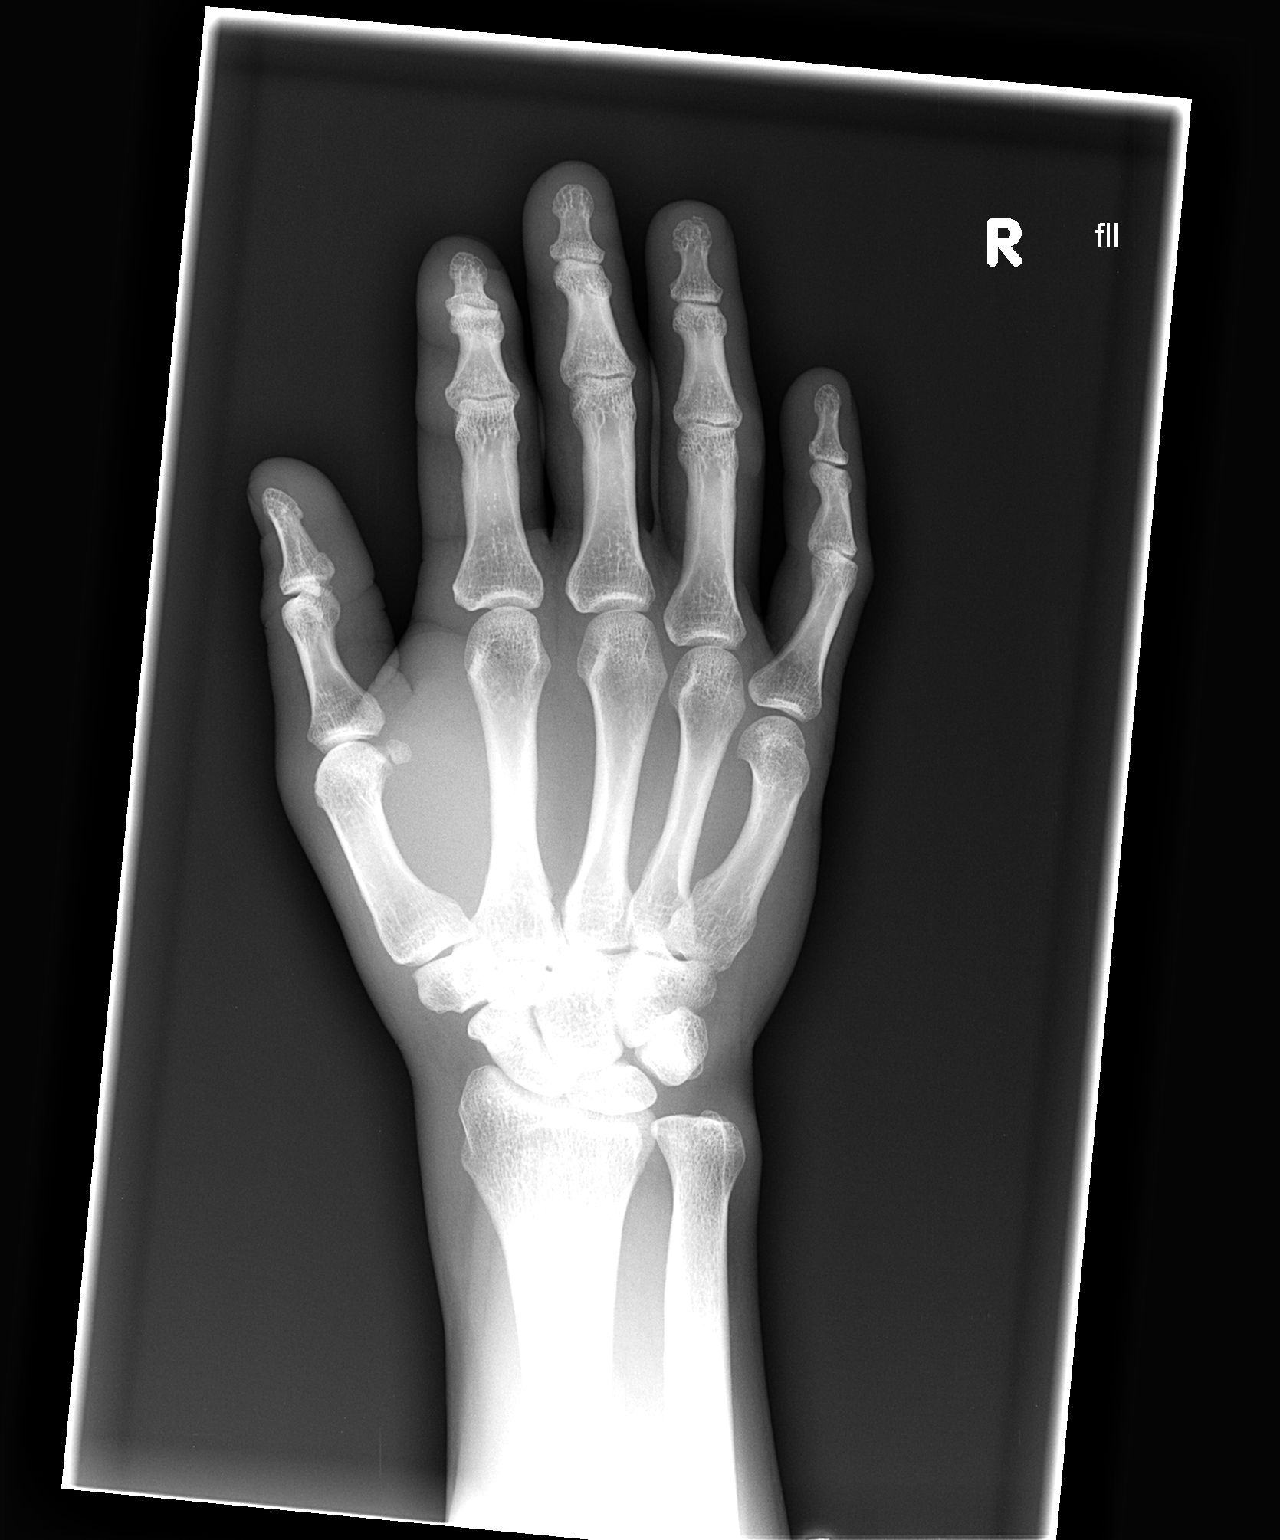

[view not recorded (2 of 3)]
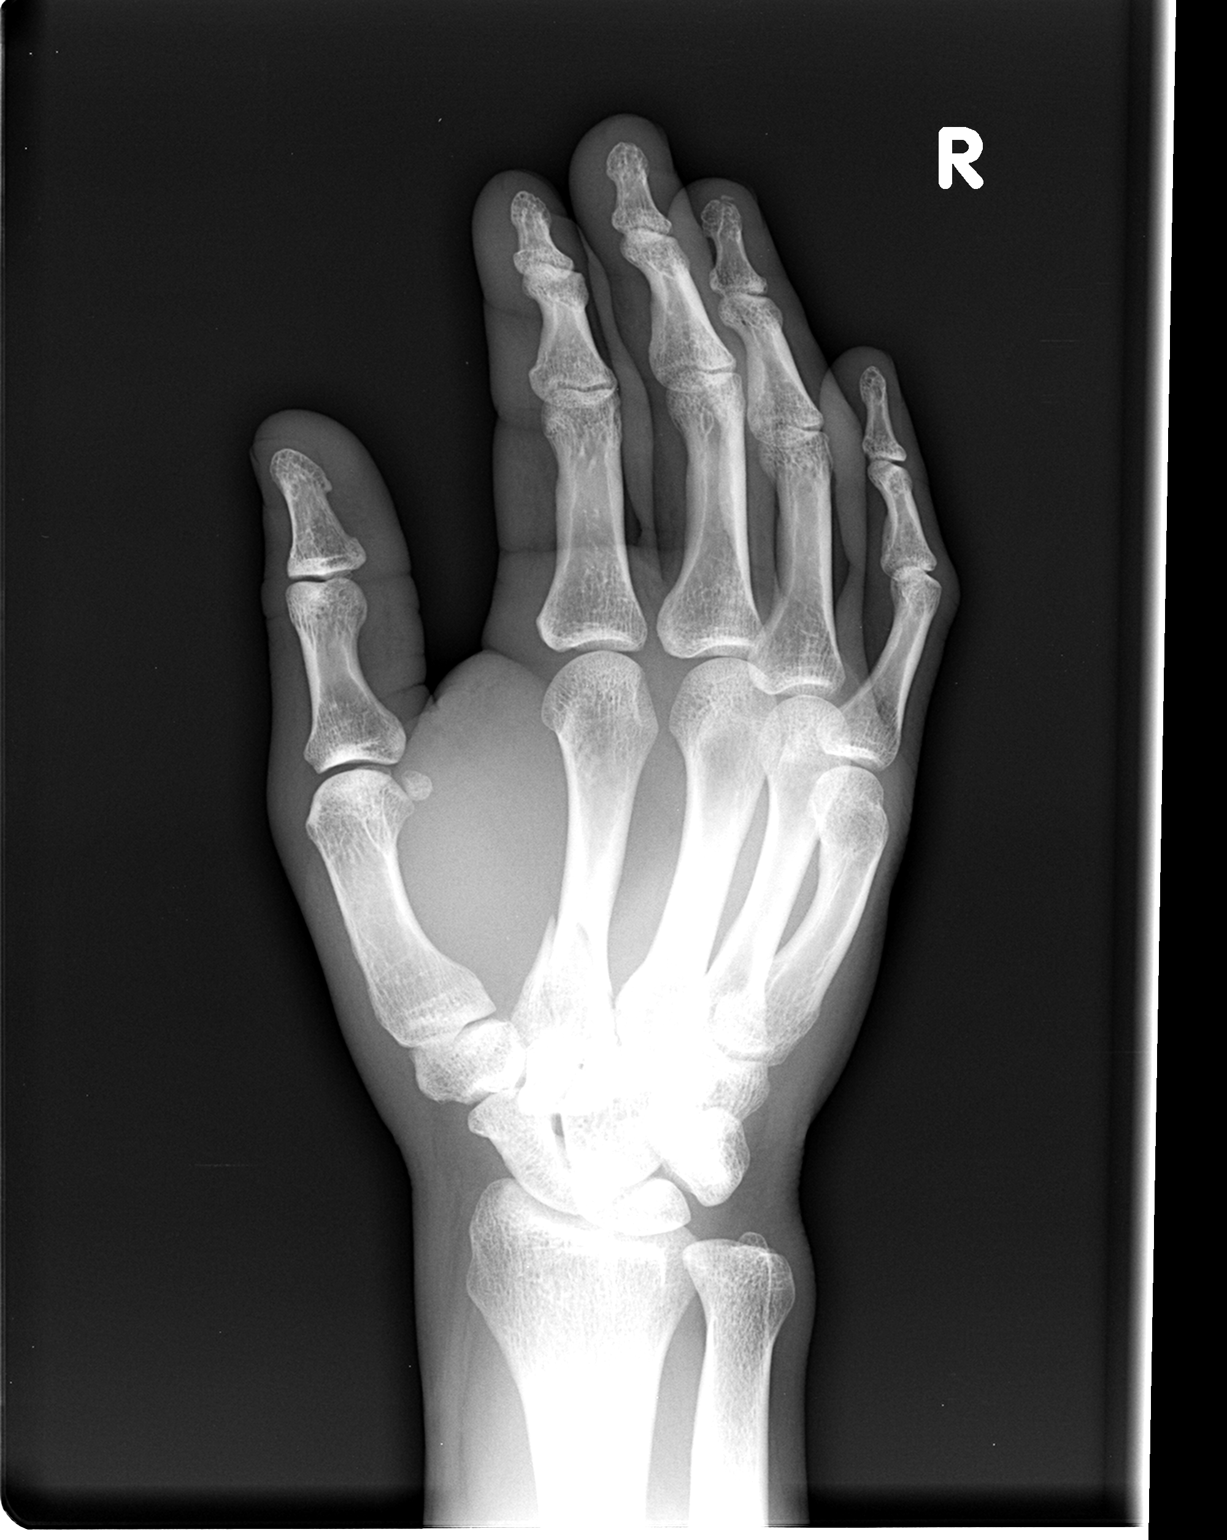

[view not recorded (3 of 3)]
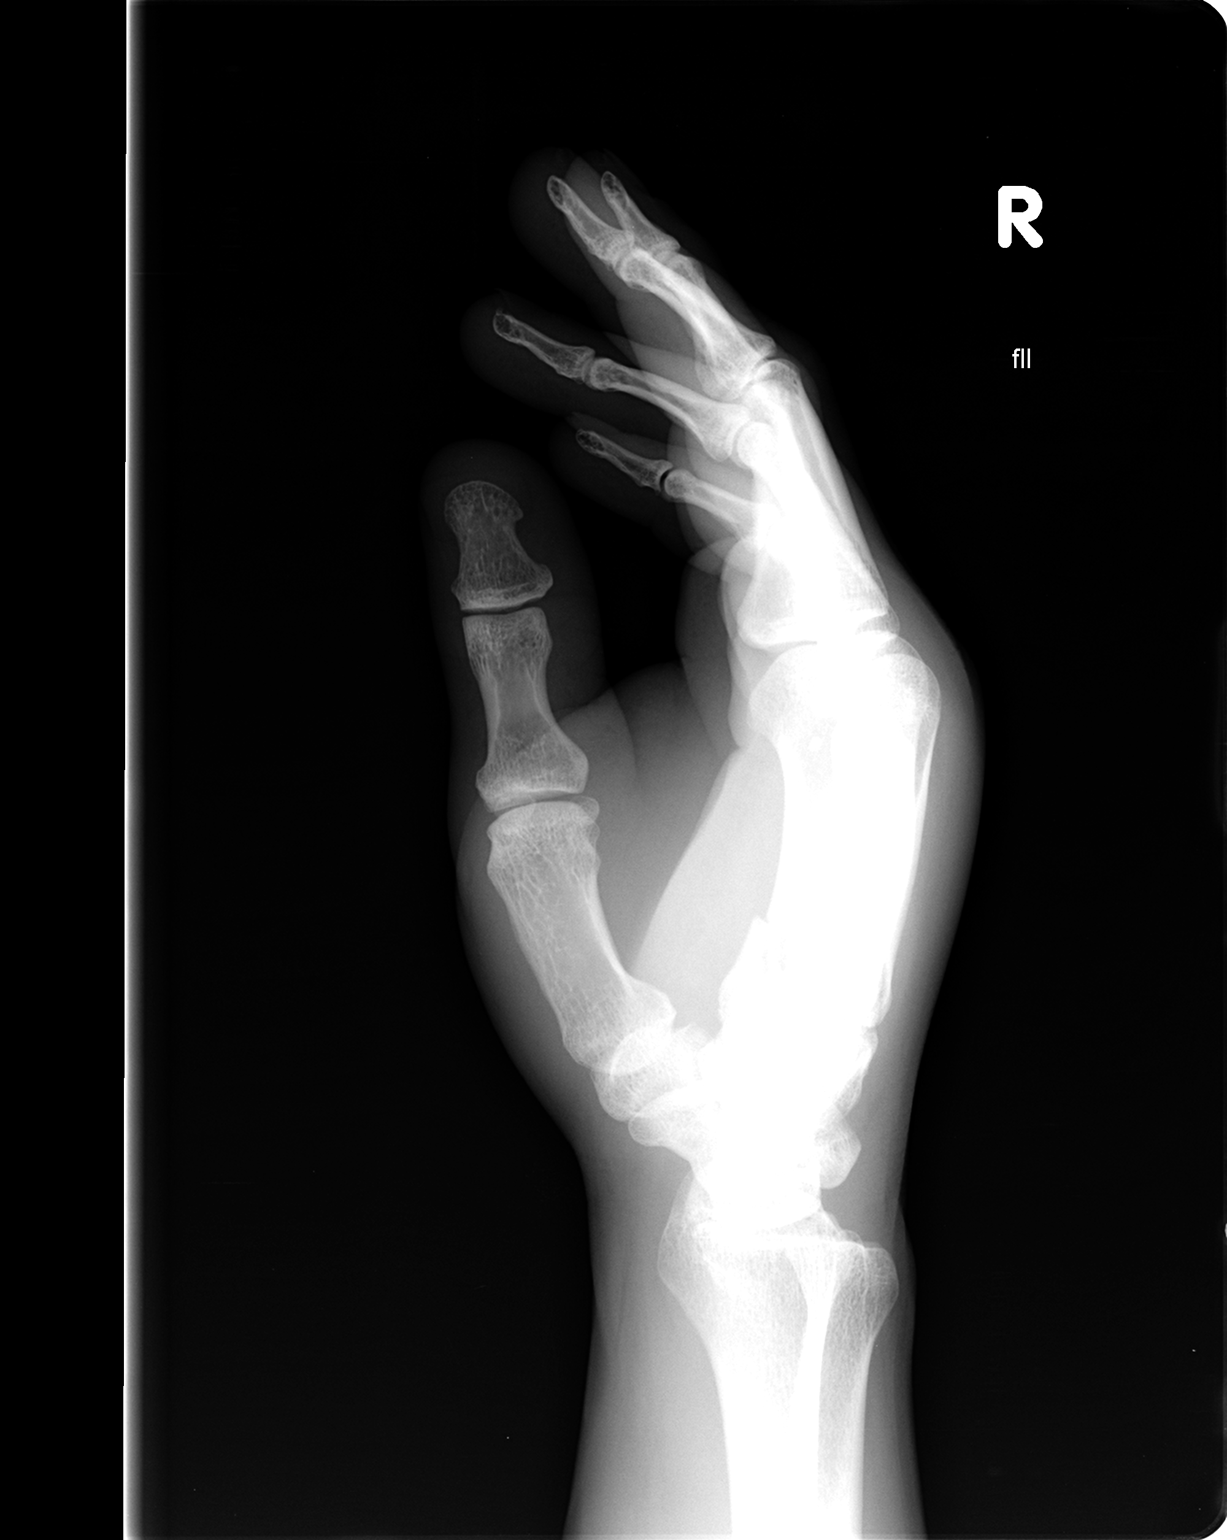

[3 of 3 positions shown; findings below may reference images not displayed]

FINDINGS: There is an acute minimally displaced fracture of the right second
metacarpal proximally. Mild diffuse soft tissue swelling. The other
metacarpals and phalanges appear intact. Preserved joint spaces. No
significant arthropathy.
IMPRESSION: Acute proximal right second metacarpal fracture

## 2017-03-06 IMAGING — CT CT CERVICAL SPINE W/O CM
3 of 6 series · 10 of 34 positions shown, 12 images · non-contrast
Comparison: None.

CLINICAL DATA: Found unresponsive.  Abrasion on forehead.

EXAM:
CT HEAD WITHOUT CONTRAST
CT CERVICAL SPINE WITHOUT CONTRAST
TECHNIQUE: Multidetector CT imaging of the head and cervical spine was
performed following the standard protocol without intravenous
contrast. Multiplanar CT image reconstructions of the cervical spine
were also generated.

[Series 5: c-spine st · axial · 0.32mm/px · z∈[+1434,+1534]mm · 2 of 101 slices shown, 3 images]
[im 26/101  soft-tissue]
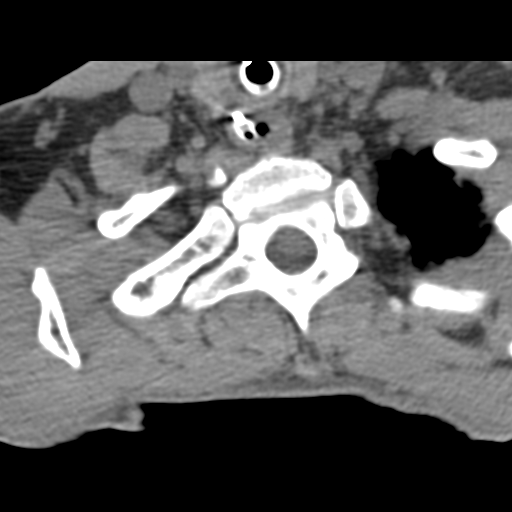
[im 26/101  bone]
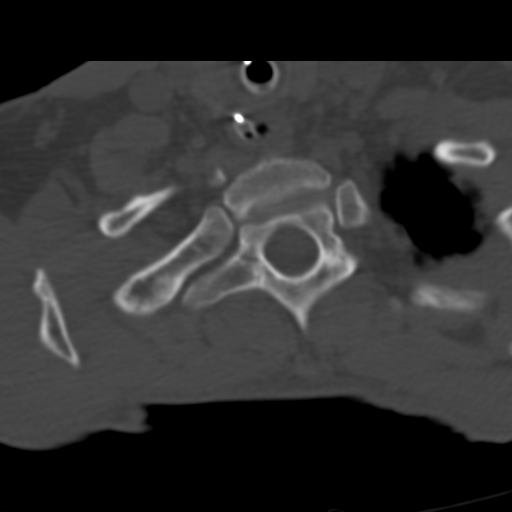
[im 76/101  bone]
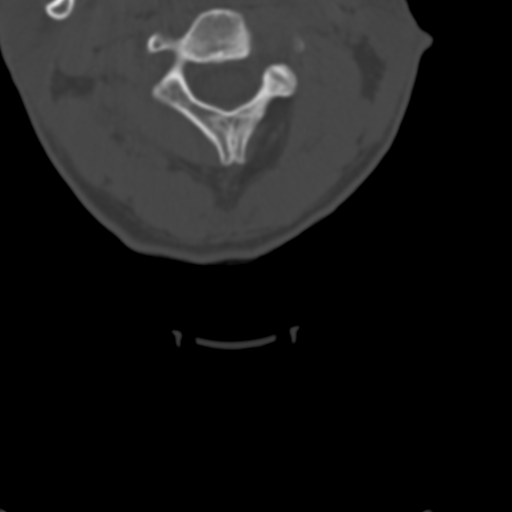

[Series 8: coronal · coronal · 0.29mm/px · 3 of 47 slices shown]
[im 10/47  bone]
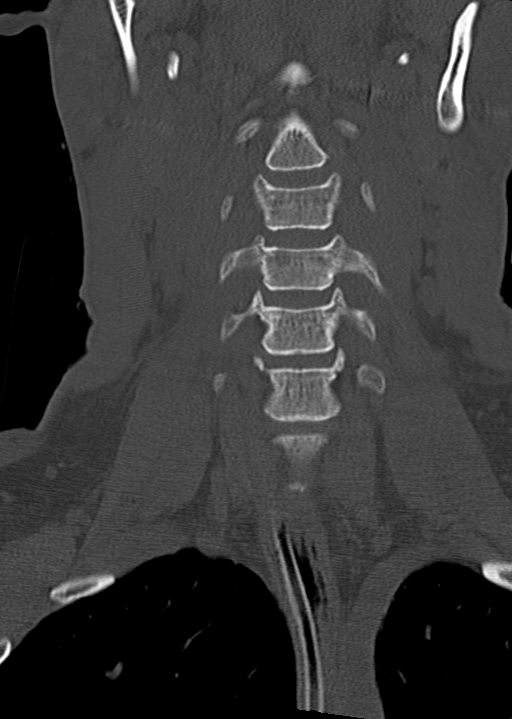
[im 19/47  bone]
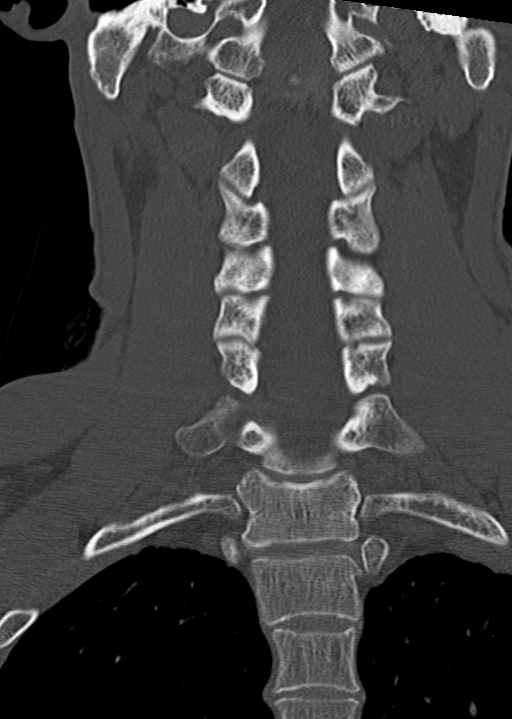
[im 28/47  bone]
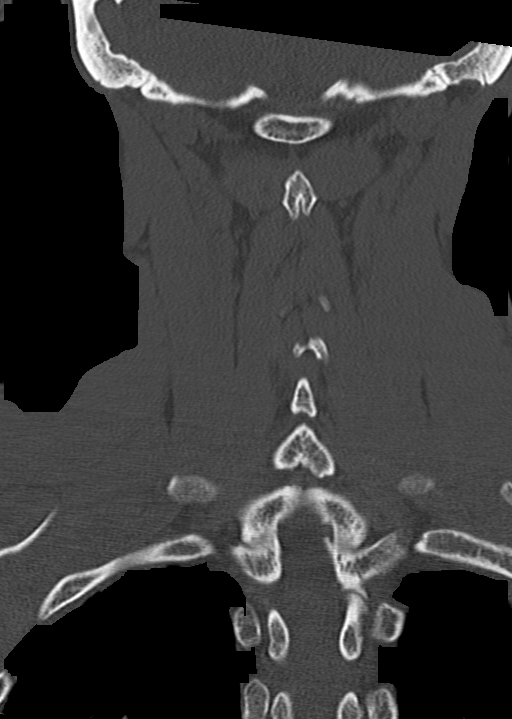

[Series 9: sagittal · sagittal · 0.19mm/px · 5 of 52 slices shown, 6 images]
[im 18/52  bone]
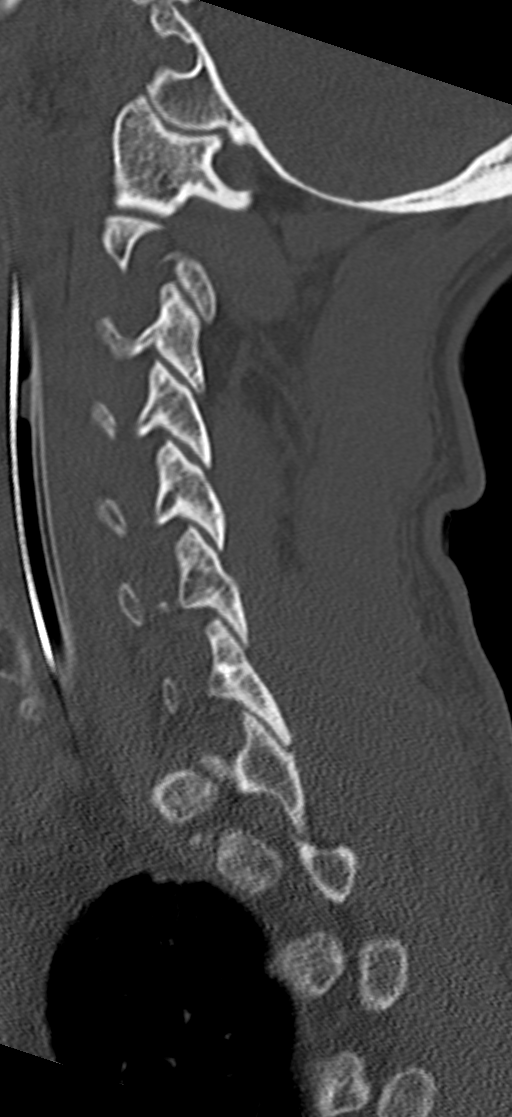
[im 22/52  bone]
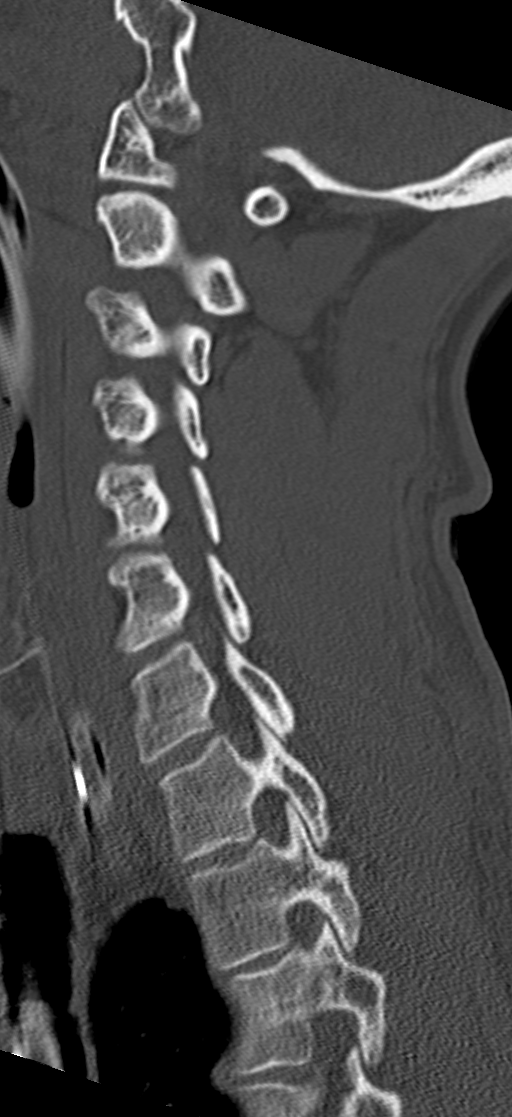
[im 26/52  soft-tissue]
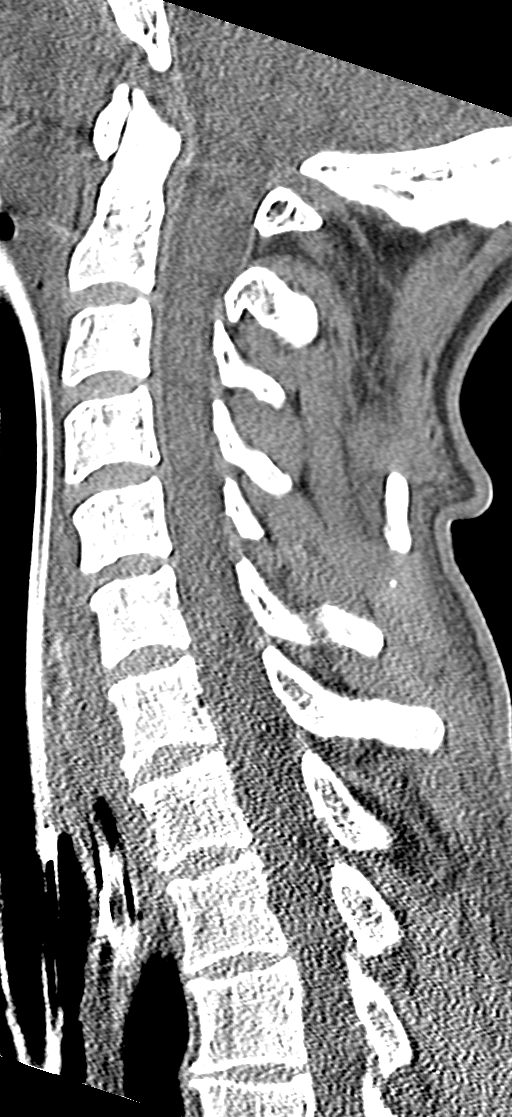
[im 26/52  bone]
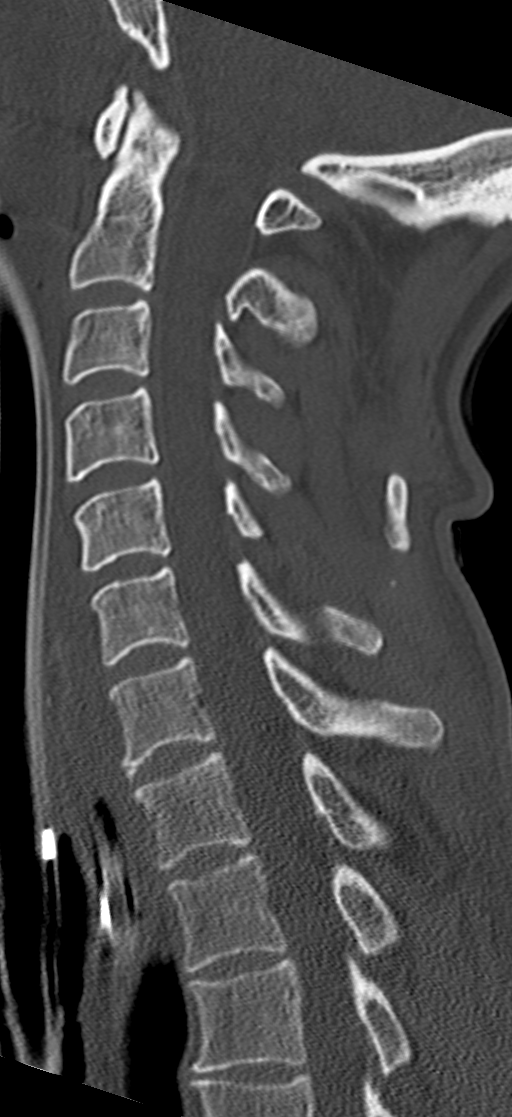
[im 30/52  bone]
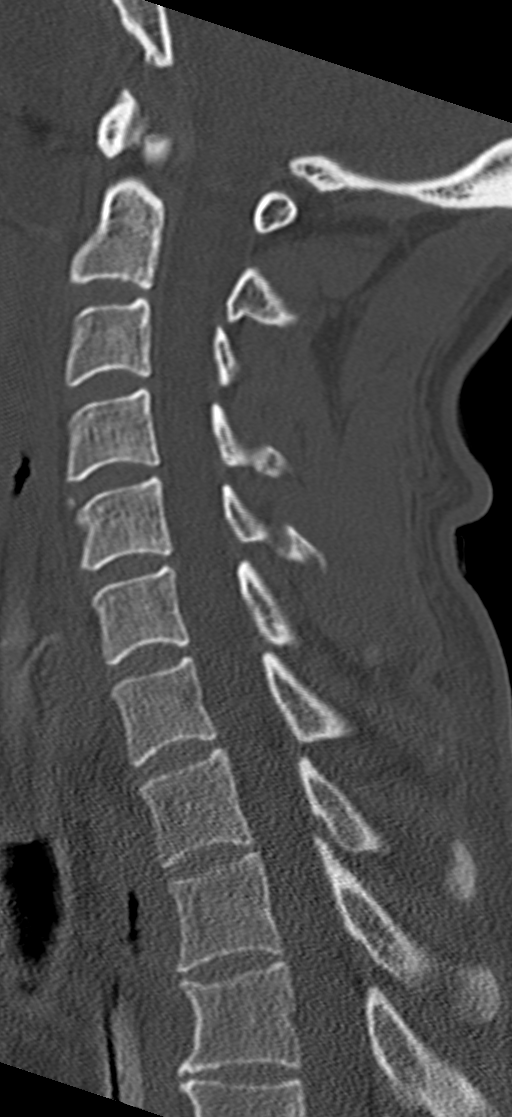
[im 35/52  bone]
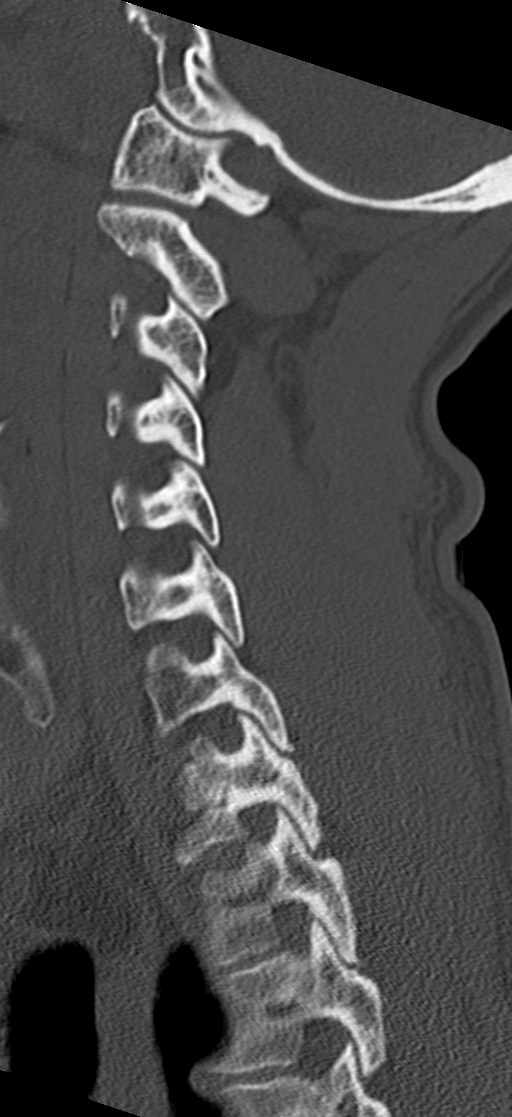

[10 of 34 positions shown; findings below may reference images not displayed]

FINDINGS: CT HEAD FINDINGS

There is no intracranial hemorrhage, mass or evidence of acute
infarction. There is no extra-axial fluid collection. There is mild
generalized atrophy. No bone abnormality. Visible paranasal sinuses
are clear. Chronic appearing mastoid sclerosis, left greater than
right.

CT CERVICAL SPINE FINDINGS

The vertebral column, pedicles and facet articulations are intact.
There is no evidence of acute fracture. No acute soft tissue
abnormalities are evident.

No significant arthritic changes are evident.
IMPRESSION: 1. Negative for acute intracranial traumatic injury. There is mild
generalized atrophy.
2. Negative for acute cervical spine fracture.

## 2022-02-27 ENCOUNTER — Encounter (HOSPITAL_COMMUNITY): Payer: Self-pay

## 2022-02-27 ENCOUNTER — Other Ambulatory Visit: Payer: Self-pay

## 2022-02-27 ENCOUNTER — Emergency Department (HOSPITAL_COMMUNITY)
Admission: EM | Admit: 2022-02-27 | Discharge: 2022-02-27 | Discharge status: Against Medical Advice | Payer: Self-pay | Attending: Emergency Medicine | Admitting: Emergency Medicine

## 2022-02-27 DIAGNOSIS — R42 Dizziness and giddiness: Secondary | ICD-10-CM | POA: Insufficient documentation

## 2022-02-27 DIAGNOSIS — Z5321 Procedure and treatment not carried out due to patient leaving prior to being seen by health care provider: Secondary | ICD-10-CM | POA: Insufficient documentation

## 2022-02-27 HISTORY — DX: Disorder of thyroid, unspecified: E07.9

## 2022-02-27 LAB — CBC WITH DIFFERENTIAL/PLATELET
Abs Immature Granulocytes: 0.03 10*3/uL (ref 0.00–0.07)
Basophils Absolute: 0.1 10*3/uL (ref 0.0–0.1)
Basophils Relative: 1 %
Eosinophils Absolute: 0 10*3/uL (ref 0.0–0.5)
Eosinophils Relative: 0 %
HCT: 41.6 % (ref 39.0–52.0)
Hemoglobin: 13.7 g/dL (ref 13.0–17.0)
Immature Granulocytes: 0 %
Lymphocytes Relative: 9 %
Lymphs Abs: 1 10*3/uL (ref 0.7–4.0)
MCH: 29.6 pg (ref 26.0–34.0)
MCHC: 32.9 g/dL (ref 30.0–36.0)
MCV: 89.8 fL (ref 80.0–100.0)
Monocytes Absolute: 1 10*3/uL (ref 0.1–1.0)
Monocytes Relative: 9 %
Neutro Abs: 8.7 10*3/uL — ABNORMAL HIGH (ref 1.7–7.7)
Neutrophils Relative %: 81 %
Platelets: 267 10*3/uL (ref 150–400)
RBC: 4.63 MIL/uL (ref 4.22–5.81)
RDW: 14.5 % (ref 11.5–15.5)
WBC: 10.9 10*3/uL — ABNORMAL HIGH (ref 4.0–10.5)
nRBC: 0 % (ref 0.0–0.2)

## 2022-02-27 LAB — COMPREHENSIVE METABOLIC PANEL
ALT: 134 U/L — ABNORMAL HIGH (ref 0–44)
AST: 94 U/L — ABNORMAL HIGH (ref 15–41)
Albumin: 4.1 g/dL (ref 3.5–5.0)
Alkaline Phosphatase: 136 U/L — ABNORMAL HIGH (ref 38–126)
Anion gap: 11 (ref 5–15)
BUN: 6 mg/dL (ref 6–20)
CO2: 25 mmol/L (ref 22–32)
Calcium: 9.7 mg/dL (ref 8.9–10.3)
Chloride: 96 mmol/L — ABNORMAL LOW (ref 98–111)
Creatinine, Ser: 0.67 mg/dL (ref 0.61–1.24)
GFR, Estimated: >60 mL/min (ref >60)
Glucose, Bld: 127 mg/dL — ABNORMAL HIGH (ref 70–99)
Potassium: 3.6 mmol/L (ref 3.5–5.1)
Sodium: 132 mmol/L — ABNORMAL LOW (ref 135–145)
Total Bilirubin: 0.9 mg/dL (ref 0.3–1.2)
Total Protein: 6.8 g/dL (ref 6.5–8.1)

## 2022-02-27 LAB — TSH: TSH: 4.715 u[IU]/mL — ABNORMAL HIGH (ref 0.350–4.500)
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
## 2022-02-27 NOTE — ED Triage Notes (Signed)
Reports having dizziness and lightheadedness for last month.  Recently released from prison and hasnt been taking thyroid medication.  Had near syncopal episode today.  Patient having vomiting.  Reports drinks a lot everyday.  Last drink was last night.

## 2022-02-27 NOTE — ED Notes (Signed)
X3 no response
